# Patient Record
Sex: Female | Born: 1990 | Hispanic: Yes | Marital: Married | Smoking: Never smoker
Health system: Southern US, Community
[De-identification: ages and names within clinical notes are randomized; demographics above are authoritative.]

## PROBLEM LIST (undated history)

## (undated) DIAGNOSIS — E282 Polycystic ovarian syndrome: Secondary | ICD-10-CM

## (undated) DIAGNOSIS — A749 Chlamydial infection, unspecified: Secondary | ICD-10-CM

## (undated) DIAGNOSIS — N200 Calculus of kidney: Secondary | ICD-10-CM

## (undated) HISTORY — DX: Polycystic ovarian syndrome: E28.2

## (undated) HISTORY — DX: Calculus of kidney: N20.0

## (undated) HISTORY — PX: WISDOM TOOTH EXTRACTION: SHX21

## (undated) HISTORY — PX: CHOLECYSTECTOMY, LAPAROSCOPIC: SHX56

## (undated) HISTORY — DX: Chlamydial infection, unspecified: A74.9

---
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
## 2011-06-10 IMAGING — CT CT ABD-PELV W/ CM
1 of 2 series · 16 of 32 positions shown, 20 images · IV contrast (isovue)
Comparison: none

REASON FOR EXAM: (1) pain L>R; (2) same
COMMENTS:

PROCEDURE:     CT  - CT ABDOMEN / PELVIS  W  - May 01, 2010  [DATE]
RESULT:     Comparison:  None
TECHNIQUE: Multiple axial images of the abdomen and pelvis were performed
from the lung bases to the pubic symphysis, with p.o. contrast and with 100
mL of Isovue 370 intravenous contrast.

[Series 2: soft tissue · axial · 0.74mm/px · z∈[-996,-556]mm · 16 of 159 slices shown, 20 images]
[im 6/159  soft-tissue]
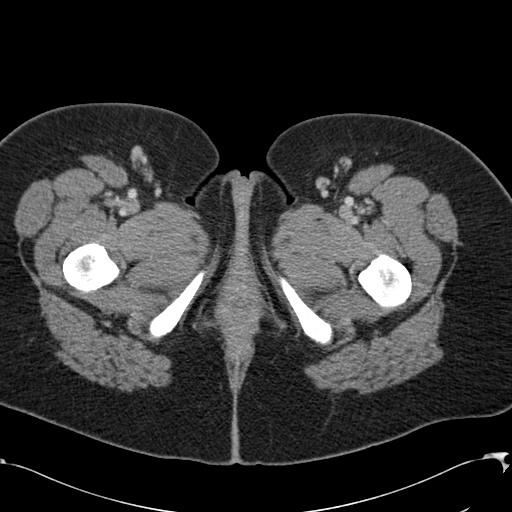
[im 6/159  bone]
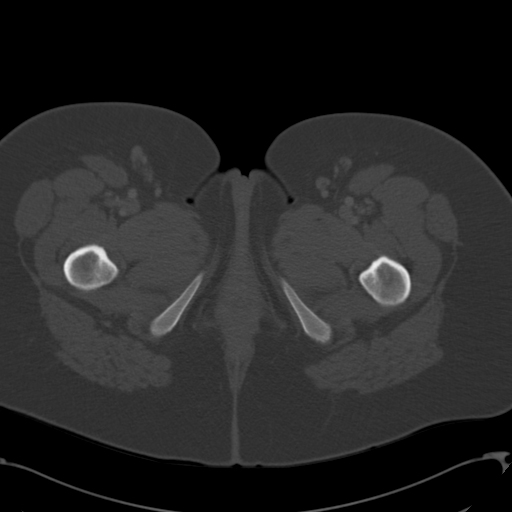
[im 18/159  soft-tissue]
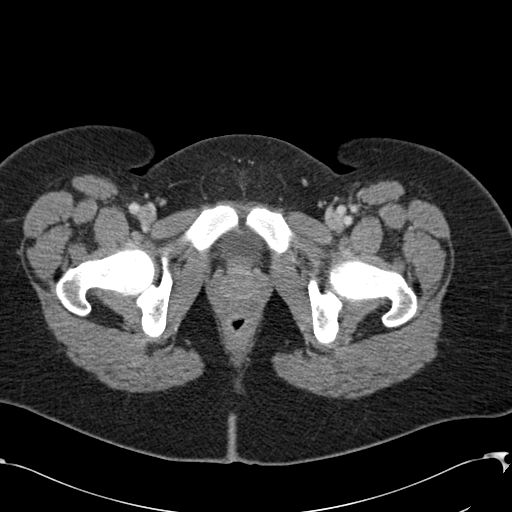
[im 30/159  soft-tissue]
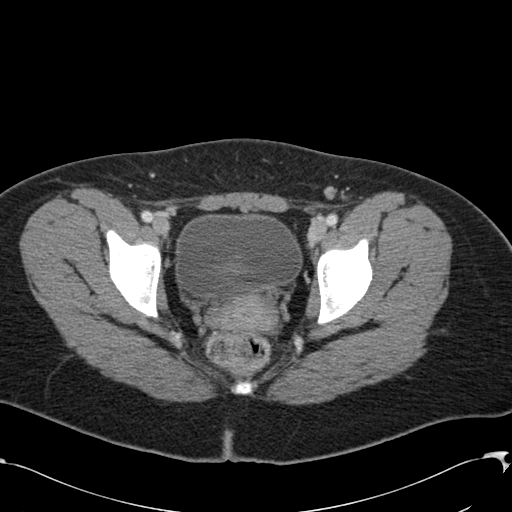
[im 41/159  soft-tissue]
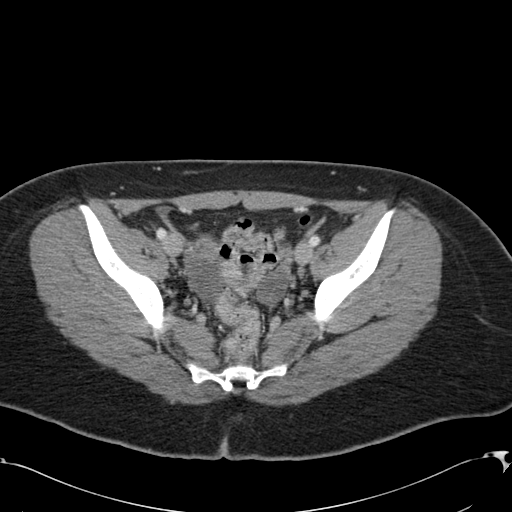
[im 53/159  soft-tissue]
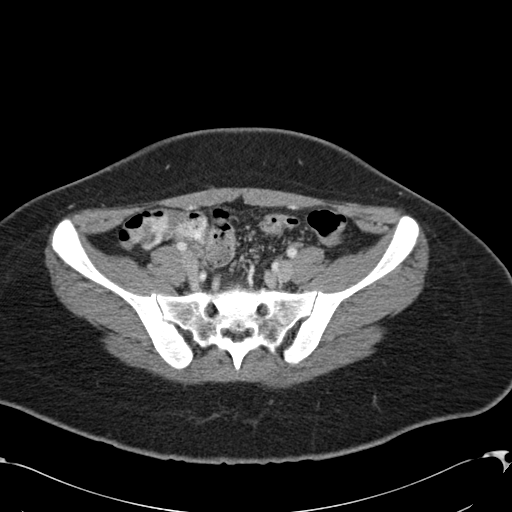
[im 65/159  soft-tissue]
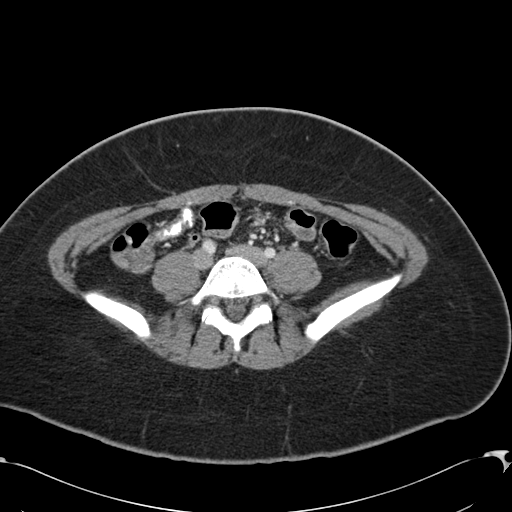
[im 77/159  soft-tissue]
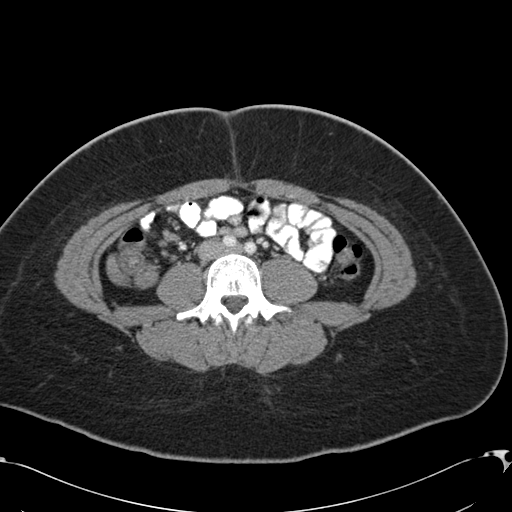
[im 82/159  soft-tissue]
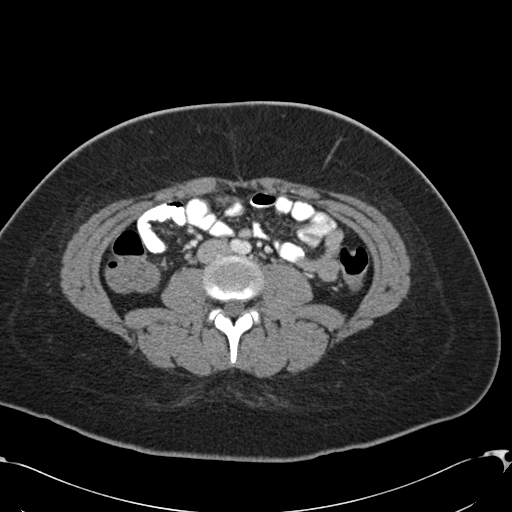
[im 94/159  soft-tissue]
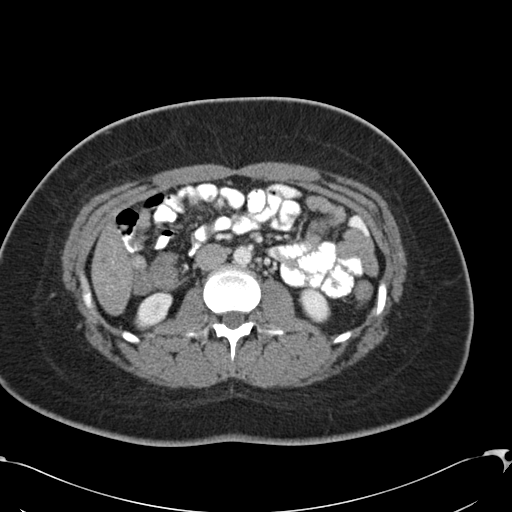
[im 94/159  bone]
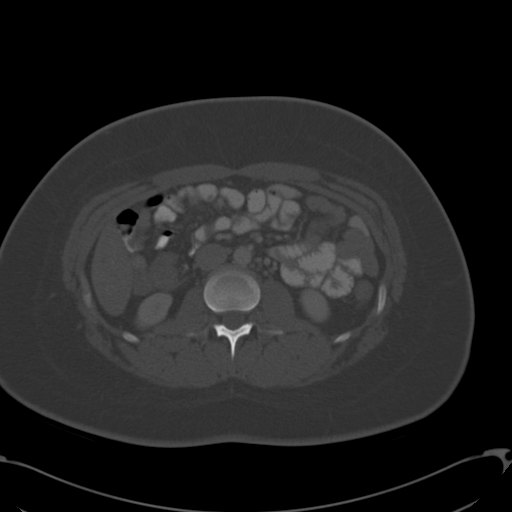
[im 106/159  soft-tissue]
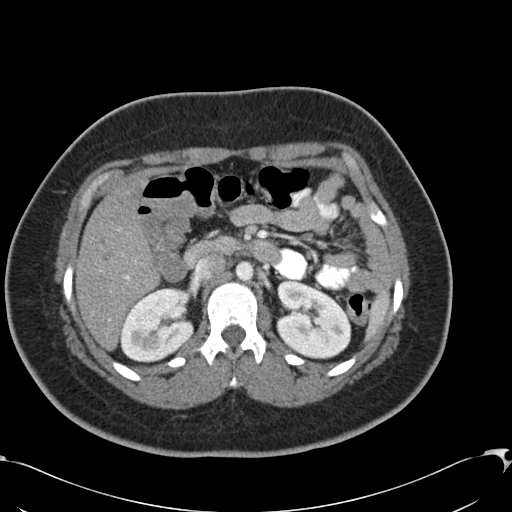
[im 118/159  soft-tissue]
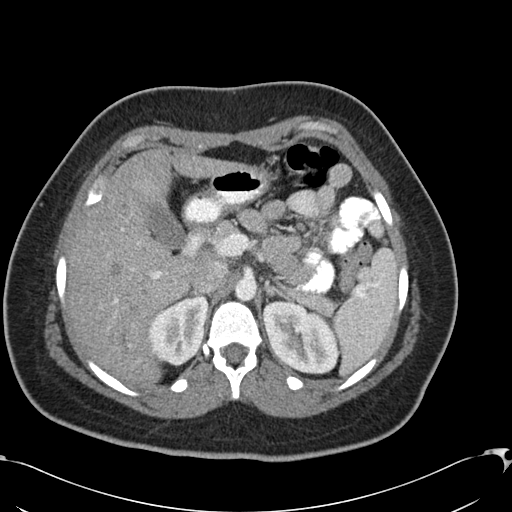
[im 129/159  soft-tissue]
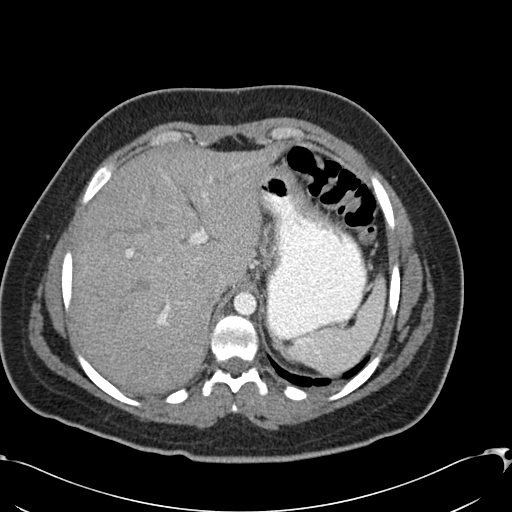
[im 135/159  lung]
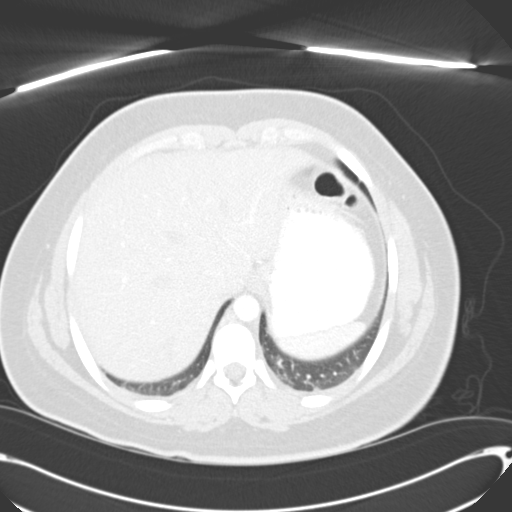
[im 141/159  soft-tissue]
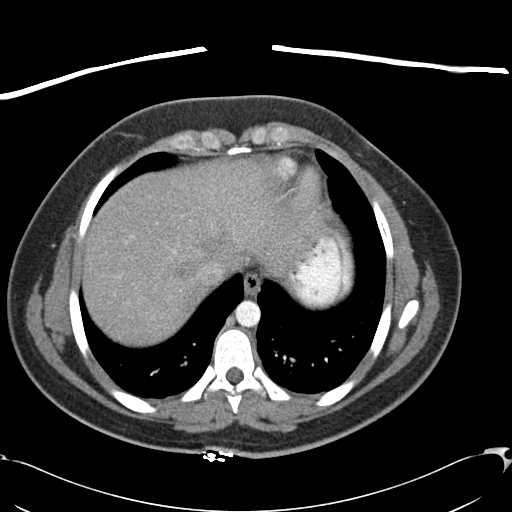
[im 141/159  lung]
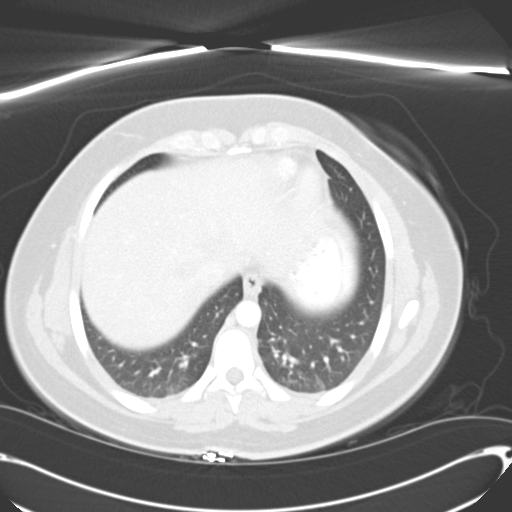
[im 147/159  lung]
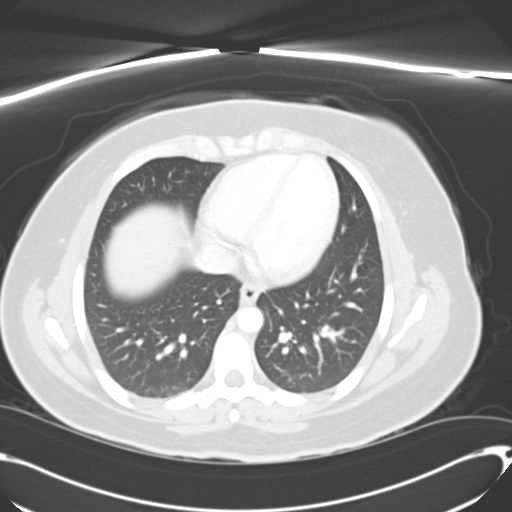
[im 153/159  soft-tissue]
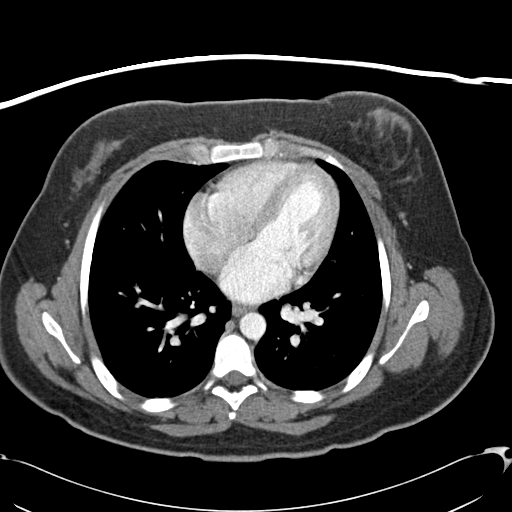
[im 153/159  lung]
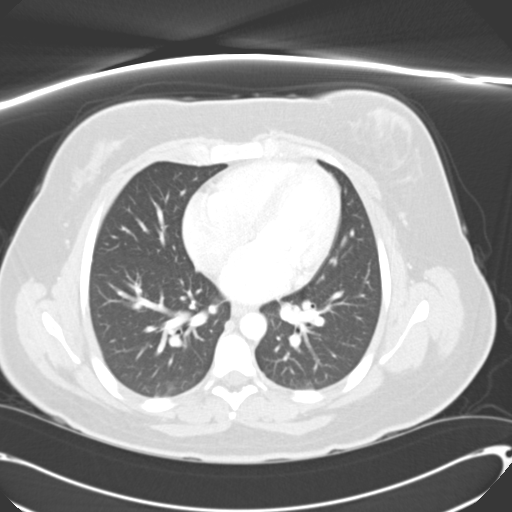

[16 of 32 positions shown; findings below may reference images not displayed]

FINDINGS: The liver, gallbladder, spleen, adrenals, pancreas, and kidneys are
unremarkable. No mesenteric or retroperitoneal lymphadenopathy. The small
and large bowel are normal in caliber. There are bilateral ovarian cysts.
The largest measures 3 cm in greatest dimension. The appendix is normal.

No aggressive lytic or sclerotic osseous lesions identified.
IMPRESSION: No acute findings in the abdomen or pelvis.

## 2012-01-21 ENCOUNTER — Other Ambulatory Visit: Payer: Self-pay

## 2012-01-21 LAB — HCG, QUANTITATIVE, PREGNANCY: Beta Hcg, Quant.: 57881 m[IU]/mL — ABNORMAL HIGH

## 2012-03-07 ENCOUNTER — Emergency Department: Payer: Self-pay | Admitting: Emergency Medicine

## 2012-03-07 LAB — COMPREHENSIVE METABOLIC PANEL
Albumin: 3.2 g/dL — ABNORMAL LOW (ref 3.8–5.6)
Anion Gap: 7 (ref 7–16)
BUN: 9 mg/dL (ref 7–18)
Calcium, Total: 8.7 mg/dL — ABNORMAL LOW (ref 9.0–10.7)
Chloride: 105 mmol/L (ref 98–107)
Co2: 24 mmol/L (ref 21–32)
EGFR (African American): >60
EGFR (Non-African Amer.): >60
Osmolality: 270 (ref 275–301)
Potassium: 3.8 mmol/L (ref 3.5–5.1)
SGPT (ALT): 27 U/L (ref 12–78)
Sodium: 136 mmol/L (ref 136–145)

## 2012-03-07 LAB — CBC WITH DIFFERENTIAL/PLATELET
Basophil #: 0 10*3/uL (ref 0.0–0.1)
Eosinophil %: 0.5 %
Lymphocyte %: 17.5 %
MCV: 86 fL (ref 80–100)
Monocyte %: 8.8 %
Platelet: 178 10*3/uL (ref 150–440)
RBC: 4.04 10*6/uL (ref 3.80–5.20)
RDW: 13 % (ref 11.5–14.5)
WBC: 11.4 10*3/uL — ABNORMAL HIGH (ref 3.6–11.0)

## 2012-03-07 LAB — HCG, QUANTITATIVE, PREGNANCY: Beta Hcg, Quant.: 92276 m[IU]/mL — ABNORMAL HIGH

## 2012-03-07 LAB — URINALYSIS, COMPLETE
Bacteria: NONE SEEN
Bilirubin,UR: NEGATIVE
Blood: NEGATIVE
Glucose,UR: NEGATIVE mg/dL (ref 0–75)
Ketone: NEGATIVE
Leukocyte Esterase: NEGATIVE
Ph: 7 (ref 4.5–8.0)
Protein: NEGATIVE
RBC,UR: 1 /HPF (ref 0–5)
Specific Gravity: 1.011 (ref 1.003–1.030)
Squamous Epithelial: 2

## 2012-03-07 LAB — WET PREP, GENITAL

## 2012-05-18 ENCOUNTER — Observation Stay: Payer: Self-pay | Admitting: Obstetrics & Gynecology

## 2012-05-18 LAB — URINALYSIS, COMPLETE
Bilirubin,UR: NEGATIVE
Blood: NEGATIVE
Glucose,UR: NEGATIVE mg/dL (ref 0–75)
Nitrite: NEGATIVE
Protein: NEGATIVE
RBC,UR: 1 /HPF (ref 0–5)
WBC UR: 4 /HPF (ref 0–5)

## 2012-09-11 ENCOUNTER — Observation Stay: Payer: Self-pay | Admitting: Obstetrics and Gynecology

## 2012-09-13 ENCOUNTER — Observation Stay: Payer: Self-pay | Admitting: Obstetrics & Gynecology

## 2012-09-16 ENCOUNTER — Inpatient Hospital Stay: Payer: Self-pay | Admitting: Obstetrics and Gynecology

## 2012-09-16 LAB — CBC WITH DIFFERENTIAL/PLATELET
Basophil #: 0 10*3/uL (ref 0.0–0.1)
Eosinophil %: 0.1 %
HCT: 40.9 % (ref 35.0–47.0)
Lymphocyte #: 1.9 10*3/uL (ref 1.0–3.6)
Lymphocyte %: 11.4 %
MCV: 88 fL (ref 80–100)
Monocyte #: 1.1 x10 3/mm — ABNORMAL HIGH (ref 0.2–0.9)
Monocyte %: 6.4 %
Neutrophil #: 13.4 10*3/uL — ABNORMAL HIGH (ref 1.4–6.5)
Neutrophil %: 81.9 %
RDW: 14 % (ref 11.5–14.5)
WBC: 16.4 10*3/uL — ABNORMAL HIGH (ref 3.6–11.0)

## 2012-09-16 LAB — PLATELET COUNT: Platelet: 138 10*3/uL — ABNORMAL LOW (ref 150–440)

## 2012-09-17 LAB — CBC WITH DIFFERENTIAL/PLATELET
Basophil %: 0.1 %
Eosinophil #: 0 10*3/uL (ref 0.0–0.7)
HCT: 36.1 % (ref 35.0–47.0)
HGB: 11.6 g/dL — ABNORMAL LOW (ref 12.0–16.0)
Lymphocyte #: 1.2 10*3/uL (ref 1.0–3.6)
MCH: 28.2 pg (ref 26.0–34.0)
MCHC: 32.2 g/dL (ref 32.0–36.0)
MCV: 88 fL (ref 80–100)
Neutrophil #: 14.9 10*3/uL — ABNORMAL HIGH (ref 1.4–6.5)
Neutrophil %: 89 %
RBC: 4.13 10*6/uL (ref 3.80–5.20)
RDW: 14.1 % (ref 11.5–14.5)
WBC: 16.7 10*3/uL — ABNORMAL HIGH (ref 3.6–11.0)

## 2012-09-21 ENCOUNTER — Emergency Department: Payer: Self-pay | Admitting: Emergency Medicine

## 2013-04-16 IMAGING — US US OB < 14 WEEKS
1 series · 14 of 28 positions shown · non-contrast
Comparison: none

REASON FOR EXAM: 12? weeks pregnant, left lower quadrant pain
COMMENTS:

PROCEDURE:     US  - US OB LESS THAN 14 WEEKS  - March 07, 2012  [DATE]
RESULT:     Comparison: None
TECHNIQUE: Multiple transabdominal gray-scale images and endovaginal
gray-scale images of the pelvis performed.

[Series 1: us ob < 14 weeks · 0.25mm/px · 14 of 62 slices shown]
[im 3/62]
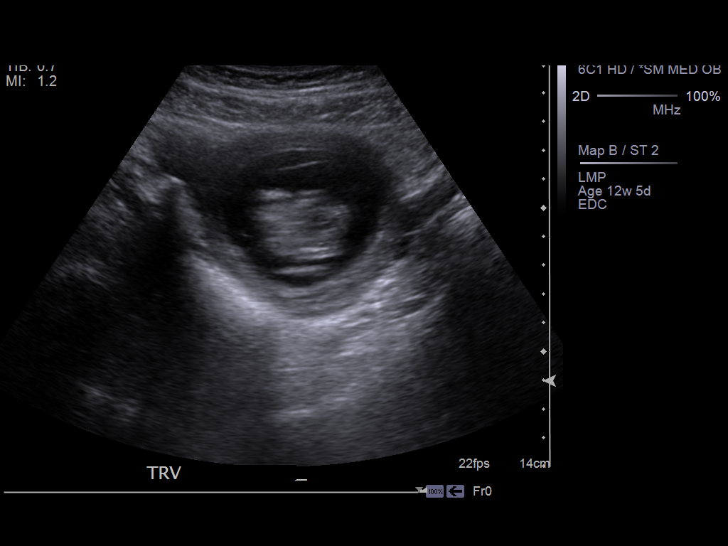
[im 7/62]
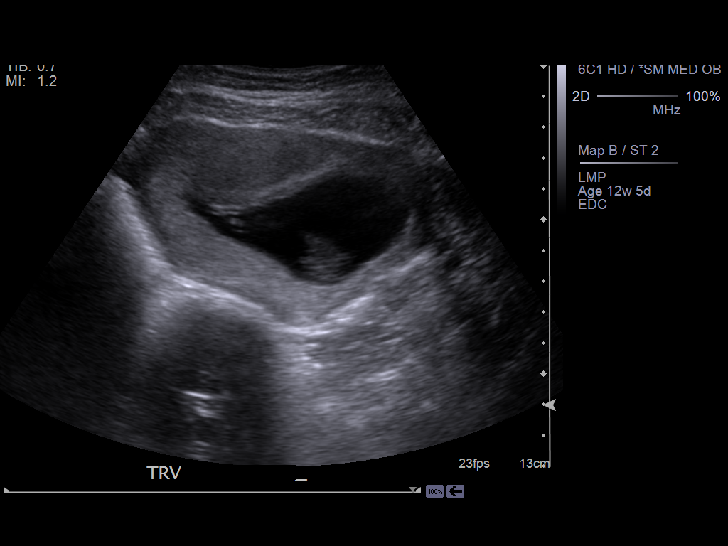
[im 12/62]
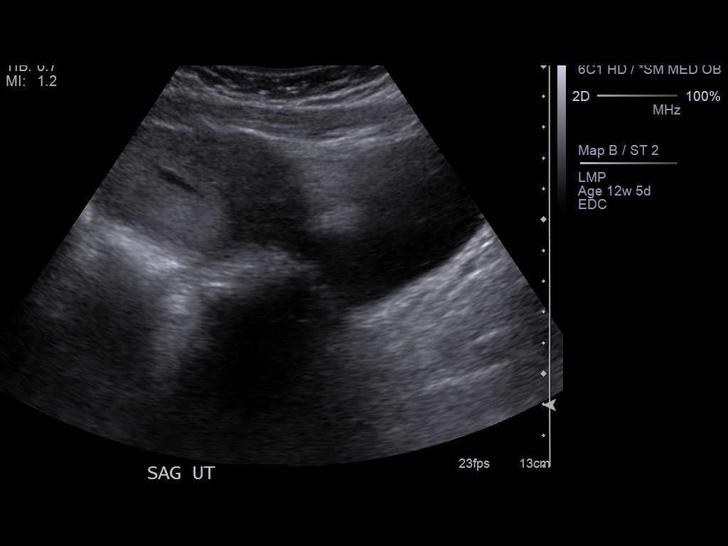
[im 16/62]
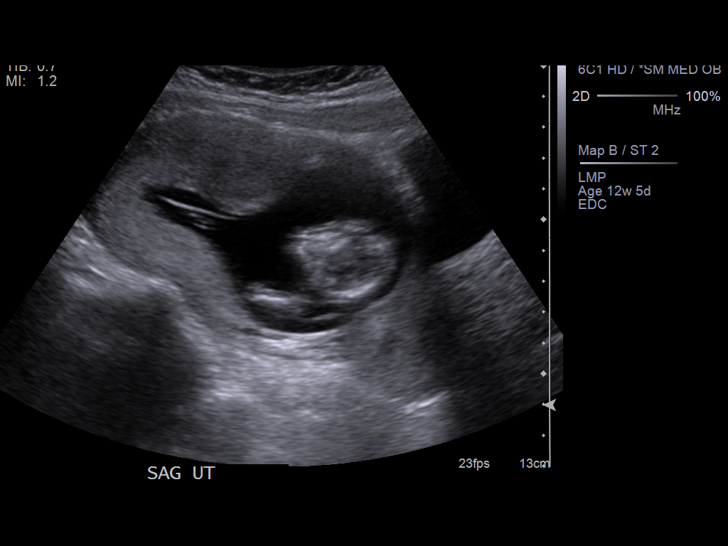
[im 21/62]
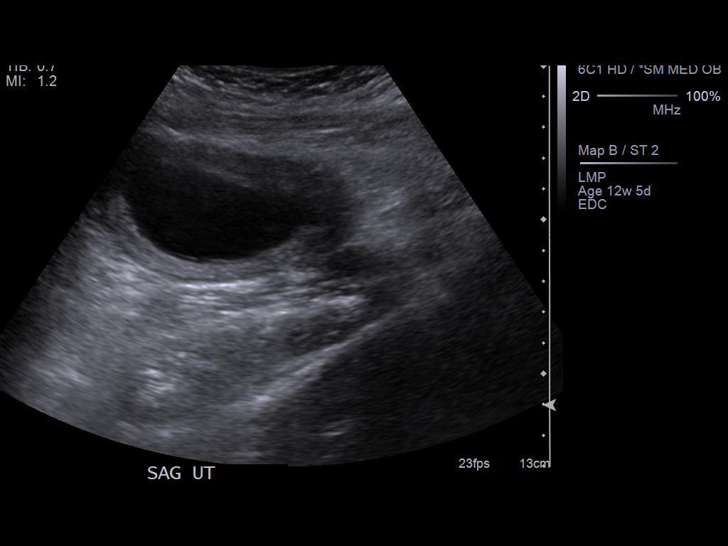
[im 25/62]
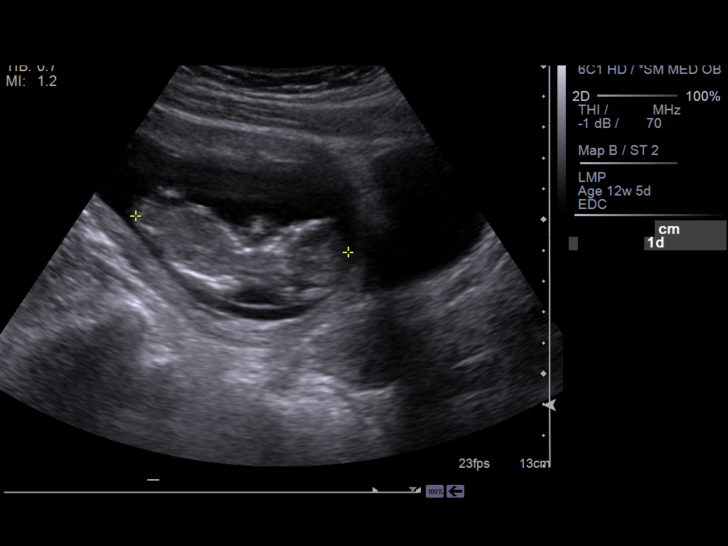
[im 30/62]
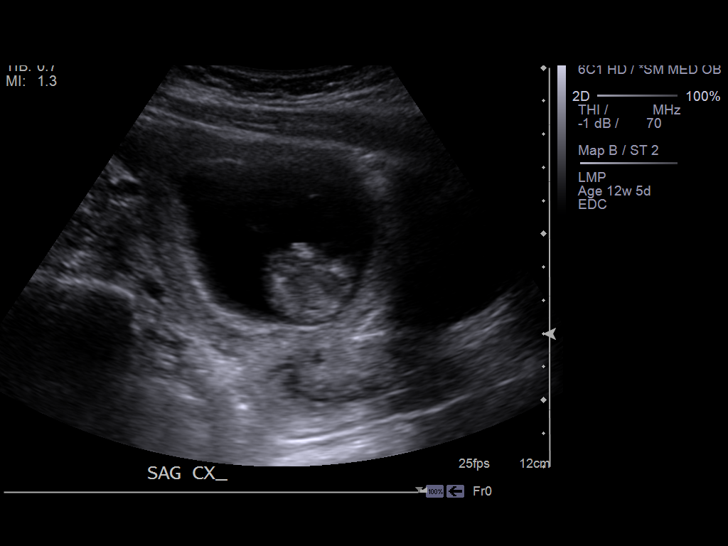
[im 34/62]
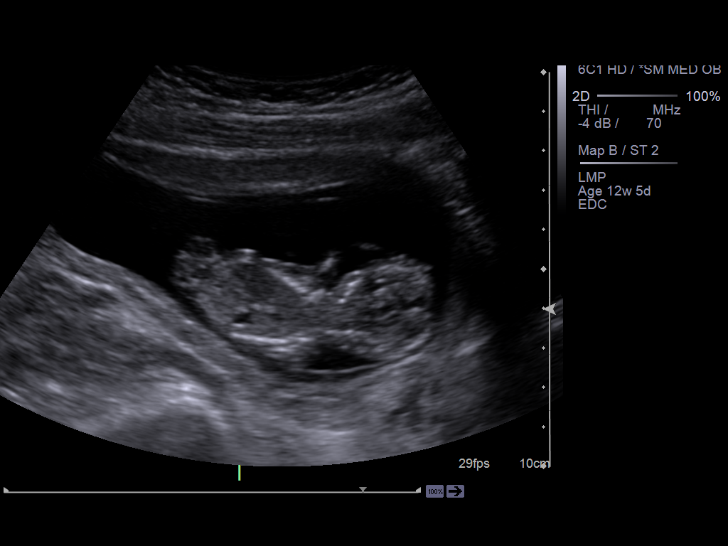
[im 39/62]
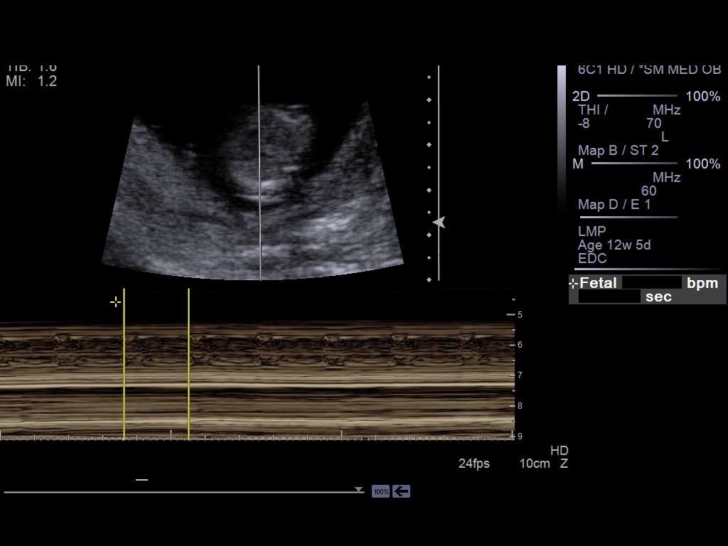
[im 43/62]
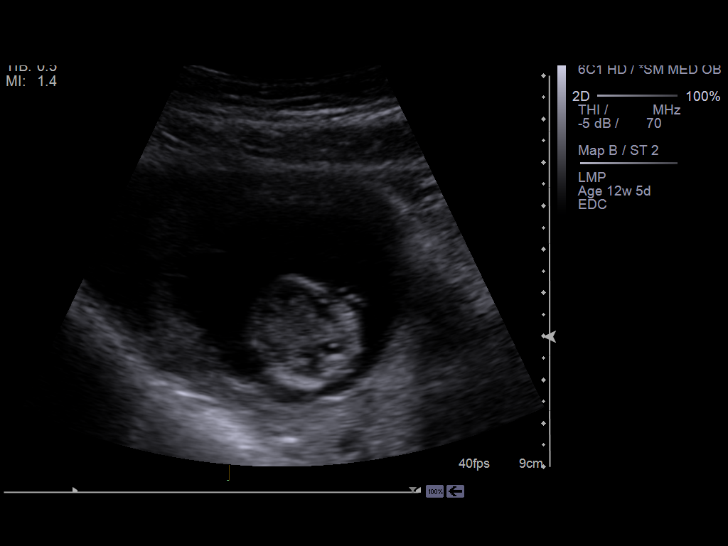
[im 48/62]
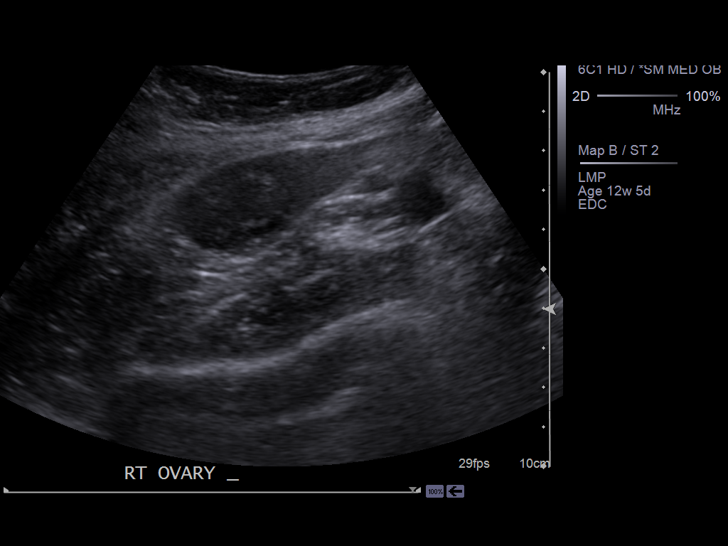
[im 52/62]
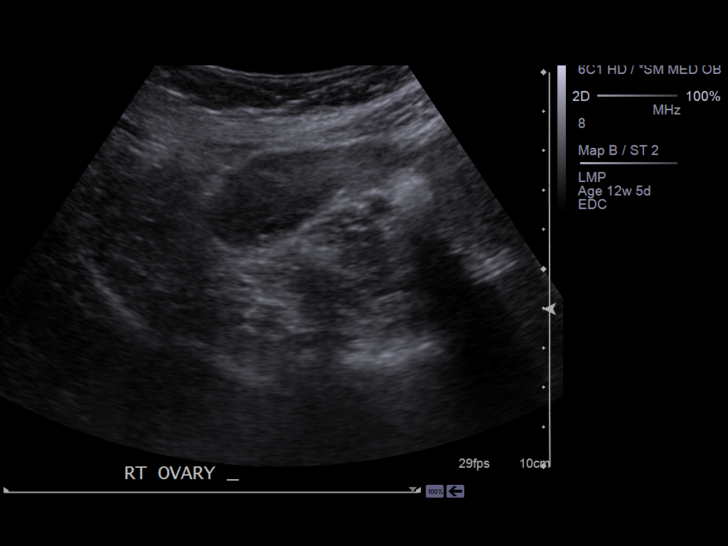
[im 57/62]
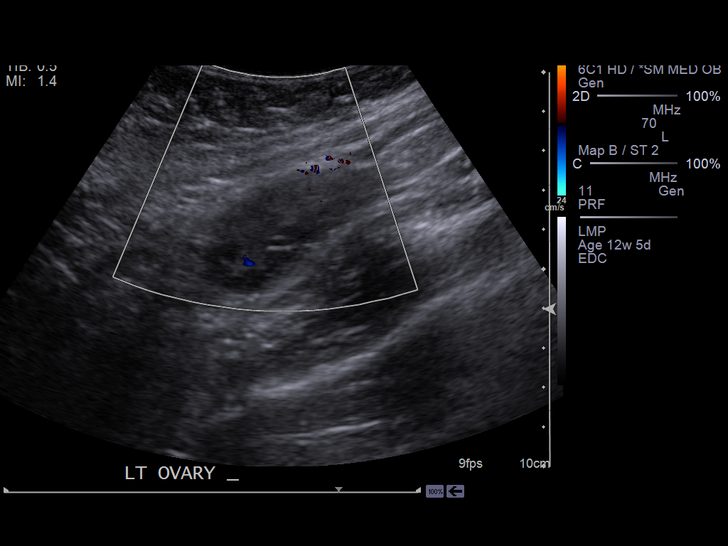
[im 62/62]
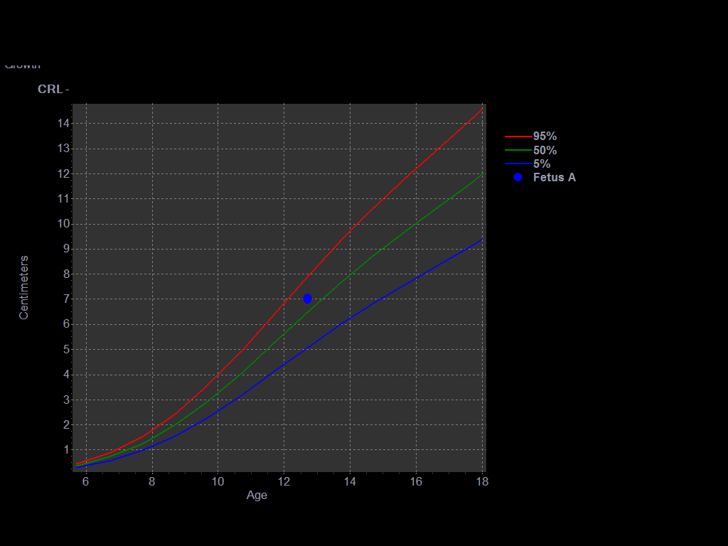

[14 of 28 positions shown; findings below may reference images not displayed]

FINDINGS: There is a single live intrauterine pregnancy visualized with a crown-rump
length of 7.01 cm dating the pregnancy at 13 weeks 2 days. There is a normal
yolk sac. There is a normal fetal heart rate of 158 beats per minute. There
is a small amount of fluid in the cervical canal.

The right ovary measures 4 x 2.7 x 2.2 cm.  The left ovary measures 4.3 x
2.4 x 2.1 cm.  There is no adnexal mass.

There is no pelvic free fluid.
IMPRESSION: Single live intrauterine pregnancy dating 13 weeks 2 days with an estimated
due date of 09/10/2012.

[REDACTED]

## 2013-08-25 ENCOUNTER — Emergency Department: Payer: Self-pay | Admitting: Emergency Medicine

## 2014-04-10 ENCOUNTER — Emergency Department: Payer: Self-pay | Admitting: Emergency Medicine

## 2014-10-04 IMAGING — CR DG CHEST 2V
1 series · 2 of 2 positions shown · non-contrast
Comparison: None.

CLINICAL DATA: Cough, vomiting and lower chest pain.

EXAM:
CHEST  2 VIEW

[Series 1: w chest pa · 0.14mm/px · 2 of 2 slices shown]
[im 1/2]
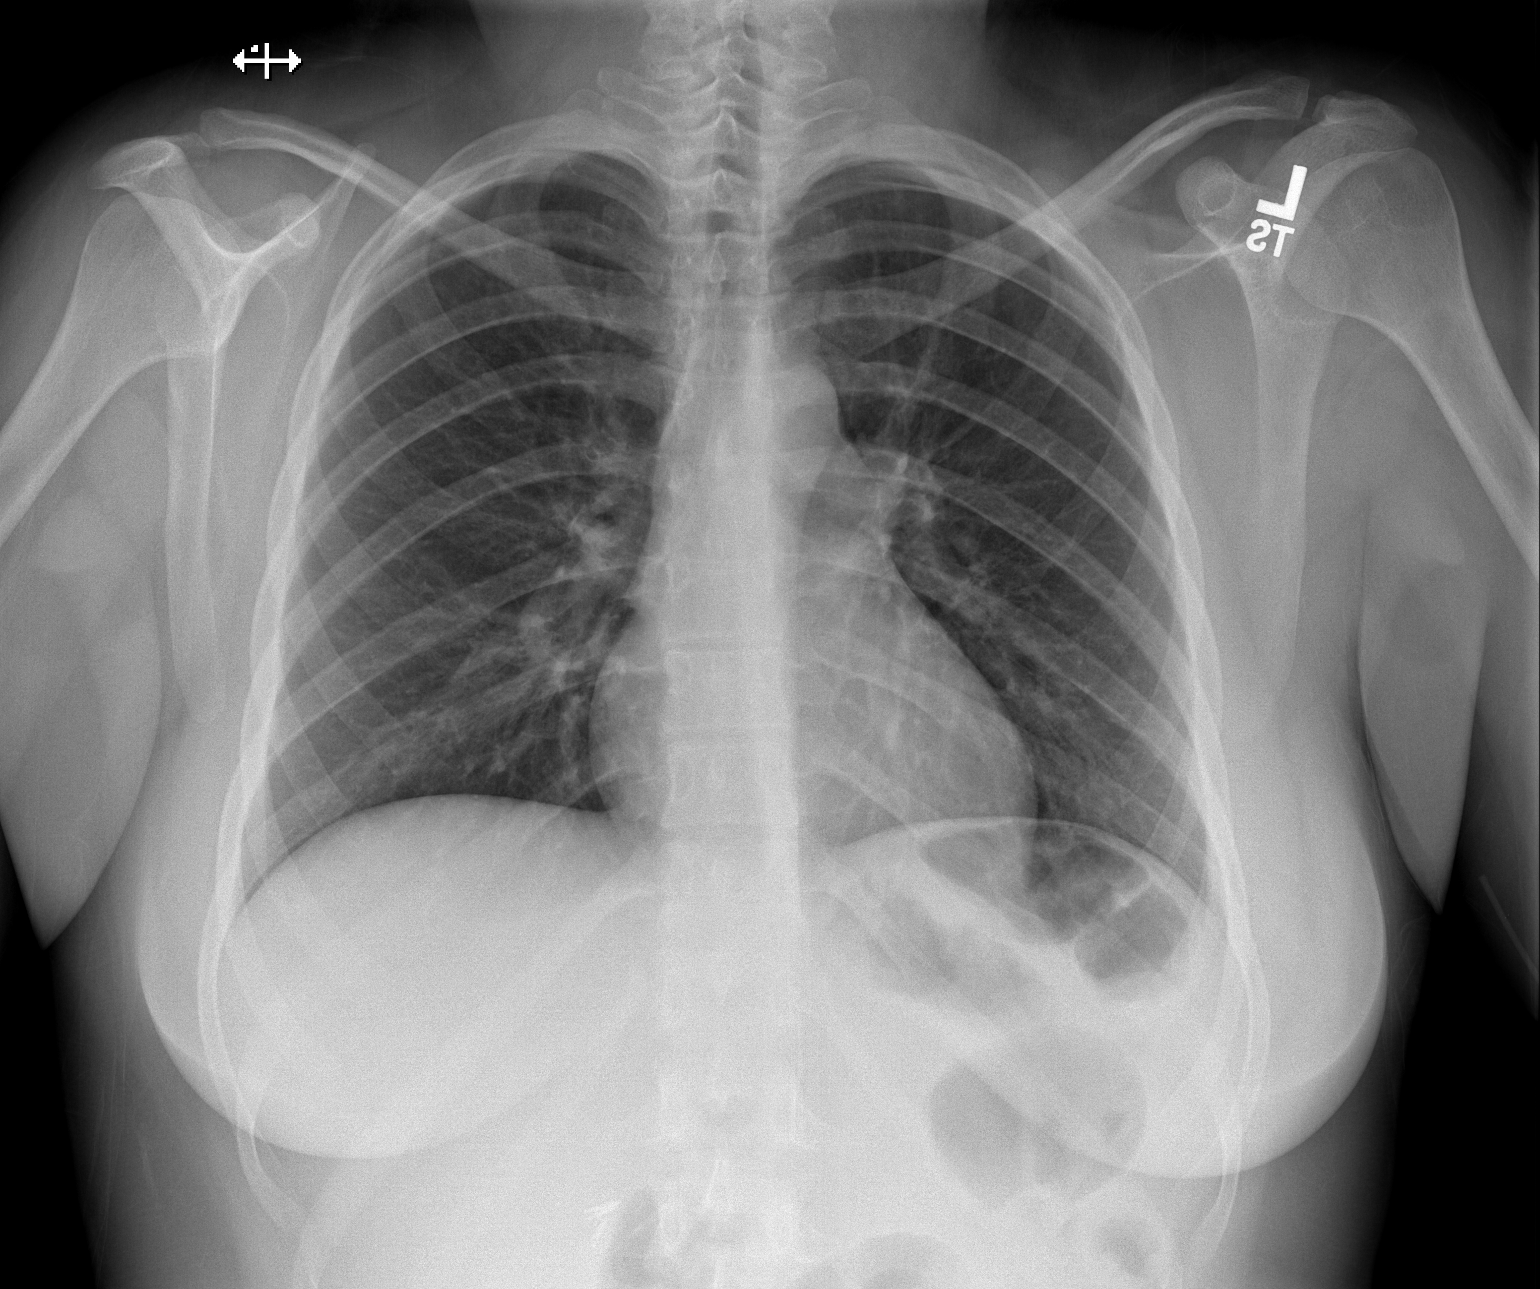
[im 2/2]
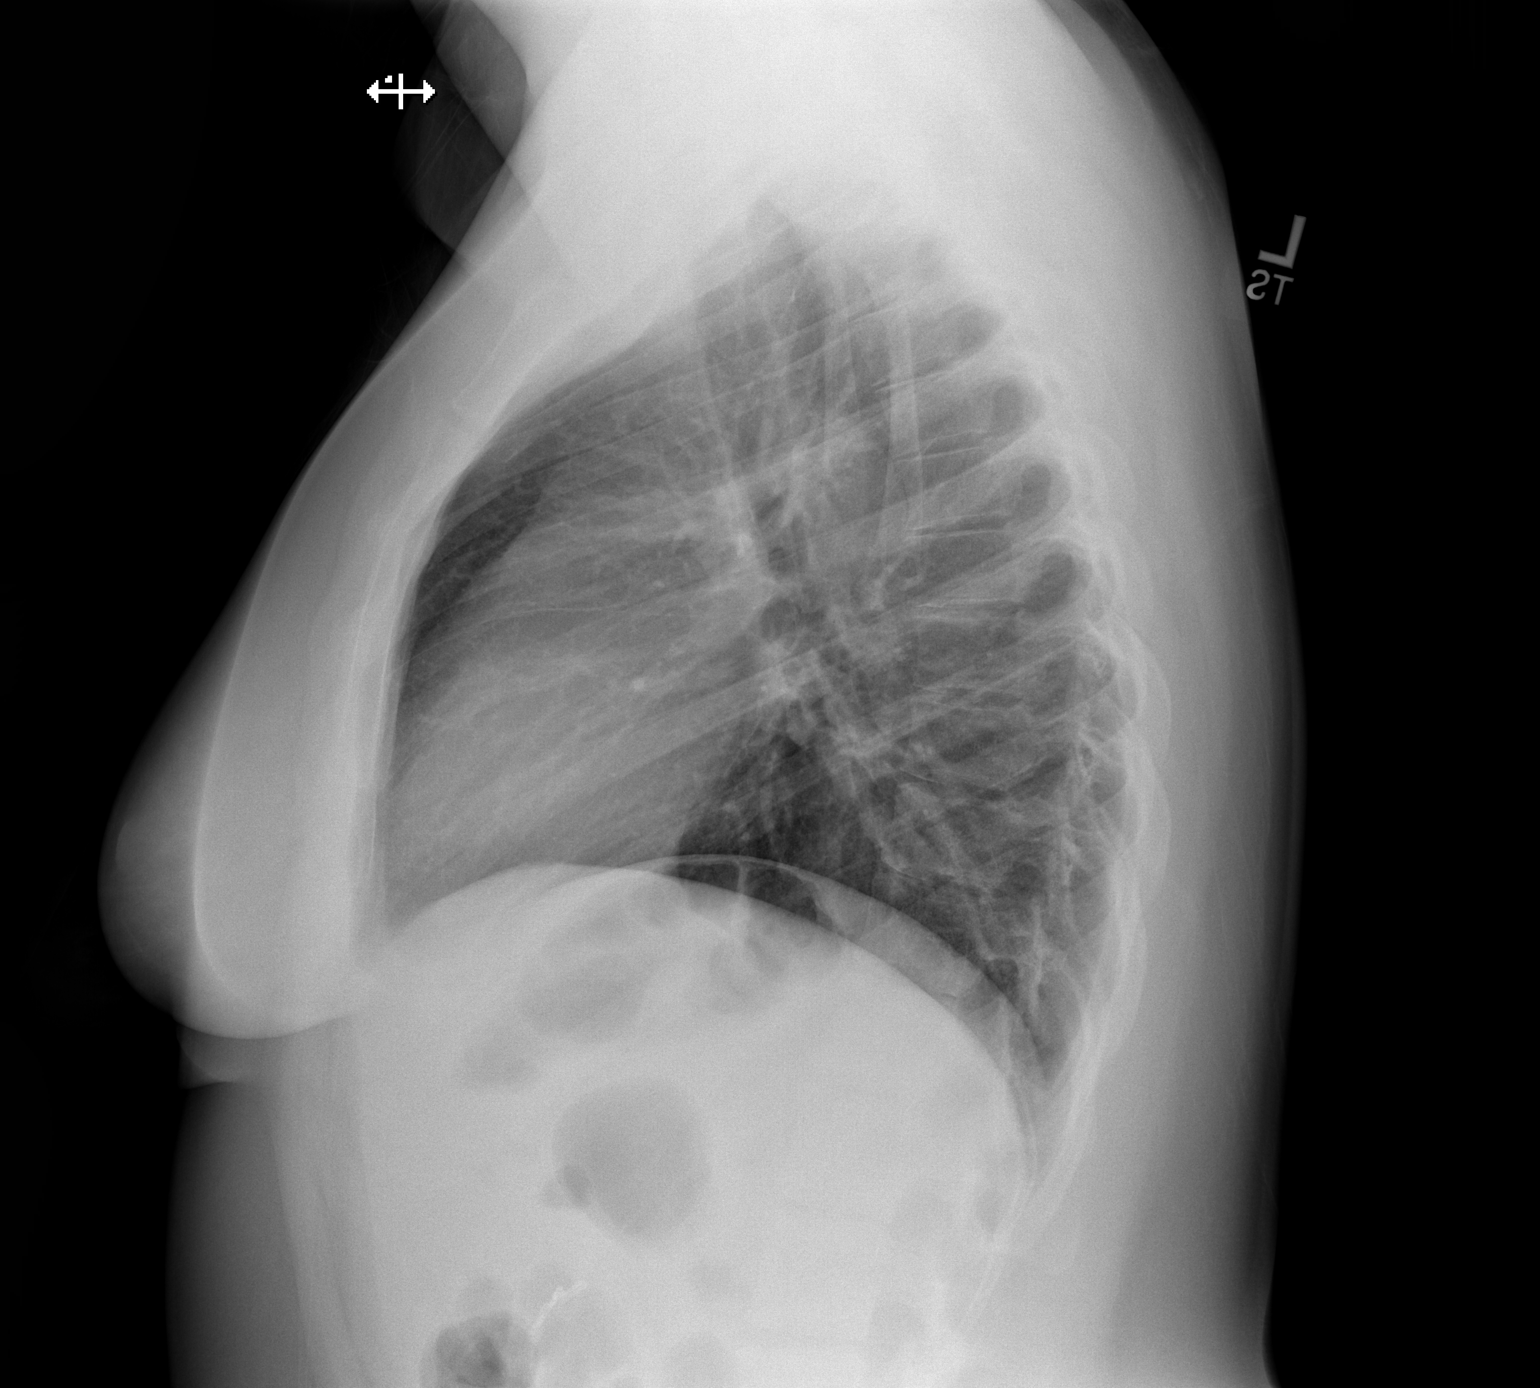

[2 of 2 positions shown; findings below may reference images not displayed]

FINDINGS: The lungs are well-aerated and clear. There is no evidence of focal
opacification, pleural effusion or pneumothorax.

The heart is normal in size; the mediastinal contour is within
normal limits. No acute osseous abnormalities are seen.
IMPRESSION: No acute cardiopulmonary process seen.

## 2014-10-22 NOTE — Op Note (Signed)
PATIENT NAME:  Nancy Ramos, Nancy Ramos MR#:  [id number] DATE OF BIRTH:  1991-04-12  DATE OF PROCEDURE:  09/16/2012  PREOPERATIVE DIAGNOSES: 1.  G1 at 40 weeks, 2 days gestation.  2.  Nonreassuring fetal surveillance, recurrent and late decelerations and fetal tachycardia.  3.  Right occiput posterior position.  4.  Terminal meconium.  5.  Failed attempted application of forceps.   POSTOPERATIVE DIAGNOSES:  1.  G1 at 40 weeks, 2 days gestation.  2.  Nonreassuring fetal surveillance, recurrent and late decelerations and fetal tachycardia.  3.  Right occiput posterior position.  4.  Terminal meconium.  5.  Failed attempted application of forceps.  OPERATION PERFORMED: Low transverse C-section via Pfannenstiel skin incision.  ANESTHESIA:  General.   PRIMARY SURGEON: Florina OuAndreas M. Bonney AidStaebler, MD.  PREOPERATIVE ANTIBIOTICS: 2 grams Ancef.   ESTIMATED BLOOD LOSS: 800 mL   OPERATIVE FLUIDS: 1600 mL   DRAINS OR TUBES:  Foley to gravity drainage and On-Q catheter system.   IMPLANTS: None.   INTRAOPERATIVE FINDINGS: The patient had an attempt at forceps placement secondary to recurrent late decelerations in the second stage of labor. The fetus was noted to be in the right occiput posterior position and the right forceps blade was unable to be brought into correct alignment to allow articulation of forceps and forceps delivery was therefore aborted. The fetus was noted to be deeply impacted within the pelvis at the time of delivery. In order to facilitate delivery, the rectus muscle on the left had to be partially cut.   INTRAOPERATIVE FINDINGS: Included normal tubes, ovaries and uterus. Delivery resulted in the birth of a liveborn female infant weighing 6 pounds 12 ounces, 3050 grams, Apgars 1 and 7.   SPECIMENS:  None.  PATIENT CONDITION FOLLOWING PROCEDURE: Stable.   PROCEDURE IN DETAIL: Risks, benefits, and alternatives of the procedure were discussed with the patient prior to proceeding to  the operating room. The patient was taken to the operating room. Spinal anesthesia was administered. She was positioned in the supine position, prepped and draped in the usual sterile fashion. A timeout was performed. The level of anesthetic was checked and noted to be adequate. Pfannenstiel skin incision was made 2 cm above the pubic symphysis and carried down sharply to the level rectus fascia. The rectus fascia was incised with the knife. The superior border of the rectus fascia was grasped with 2 Kocher clamps. The underlying rectus muscle was dissected off the fascia using blunt dissection and the median raphe incised using Mayo scissors. This was then repeated for the inferior border of the rectus fascia in a similar manner. The midline was identified. The peritoneum was entered bluntly. A bladder blade was placed. A bladder flap was then developed using Metzenbaum scissors, after which the bladder blade was replaced and the bladder was displaced caudad. A low transverse incision was made on the uterus and the uterus was entered bluntly using the operator'Ramos finger. On placing the operator'Ramos hand into the hysterotomy, the infant was noted to be in the right occiput posterior position. There was some difficulty coping and breaking the suction secondary to low station of the fetus. In order to facilitate delivery of the fetal head, the left rectus muscle had to be partially incised using bandage scissors. The fetus was delivered using fundal pressure. After flexing the vertex and bring it into the hysterotomy, the remainder of the body delivered without difficulty. The infant was suctioned, cord was clamped and cut before handing the infant to the  awaiting pediatrician. Cord gas was obtained both venous and arterial. The arterial sample revealed a pH of 7.08 with base excess of -8.4. The venous sample revealed a pH of 7.16 with base excess of -6.6. Cord blood was also obtained. The placenta was delivered using  manual extraction. The uterus was exteriorized and repaired in a 2-layer manner, with the first layer being a running locked stitch of 0 Vicryl with the second horizontal imbricating layer of 0 Vicryl. The uterus was returned to the abdomen. The peritoneal gutters were wiped clean of clots and debris using 2 moist laps. The peritoneum was then closed using a 2-0 Vicryl in a running fashion. Single mattress stitch was used to reapproximate the left rectus muscle with good hemostasis noted. The On-Q catheter system was then placed superiorly to the Pfannenstiel skin incision as per usual protocol. The fascia was closed using a looped #1 PDS in a running fashion. The subcutaneous tissue was irrigated and hemostasis achieved using the Bovie. Skin was closed using insorb staples. Sponge, needle and instrument counts were correct x 2. The patient tolerated the procedure well and was taken to the recovery room in stable condition.     ____________________________ Florina Ou. Bonney Aid, MD ams:cc D: 09/18/2012 17:44:37 ET T: 09/18/2012 19:02:09 ET JOB#: [id number]  cc: Florina Ou. Bonney Aid, MD, <Dictator> [place] Cathrine Muster MD ELECTRONICALLY SIGNED 09/19/2012 9:27

## 2014-11-09 NOTE — H&P (Signed)
L&D Evaluation:  History:   HPI 24yo G1 at 824w1d by D=6wk U/S presents with c/o very light bleeding mixed with mucous, only with urination over the last 12 hours.  No bleeding on panties between voids.  Also c/o mild cramping.  No suprapubic pain or dysuria.  +FM.  No LOF.  [facility] by first trimester threatened ab.  Blood type Rh+.  Posterior placenta.    Presents with vaginal bleeding    Patient's Medical History Obesity    Patient's Surgical History Colecystectomy  WTE    Medications Pre Natal Vitamins    Allergies NKDA    Social History none    Family History Non-Contributory   ROS:   ROS All systems were reviewed.  HEENT, CNS, GI, GU, Respiratory, CV, Renal and Musculoskeletal systems were found to be normal., except as noted in HPI   Exam:   Vital Signs stable    General no apparent distress    Mental Status clear    Chest normal effort    Abdomen gravid, non-tender, no suprapubic tenderness    Back no CVAT    Pelvic no external lesions, cervix closed and thick, no blood in vault    Mebranes Intact    FHT normal rate with no decels    Ucx absent    Skin dry    Other UA negative   Impression:   Impression 3224w1d with light vaginal bleeding, benign exam and evaluation   Plan:   Plan reassurance provided, reviewed PTL and bleeding precautions    Follow Up Appointment already scheduled. in 1 week   Electronic Signatures: Garnette GunnerStansbury Clipp, Ali LoweEryn K (MD)  (Signed [phone]-Nov-13 21:34)  Authored: L&D Evaluation   Last Updated: 17-Nov-13 21:34 by Garnette GunnerStansbury Clipp, Ali LoweEryn K (MD)

## 2017-06-17 ENCOUNTER — Ambulatory Visit: Payer: Self-pay | Admitting: Obstetrics and Gynecology

## 2017-06-20 ENCOUNTER — Encounter: Payer: Self-pay | Admitting: Obstetrics and Gynecology

## 2017-06-20 ENCOUNTER — Ambulatory Visit: Payer: Self-pay | Admitting: Obstetrics and Gynecology

## 2018-01-21 ENCOUNTER — Ambulatory Visit: Payer: Self-pay | Admitting: Physician Assistant

## 2018-01-21 VITALS — BP 130/86 | HR 79 | Resp 16 | Ht 61.0 in | Wt 225.0 lb

## 2018-01-21 DIAGNOSIS — G44209 Tension-type headache, unspecified, not intractable: Secondary | ICD-10-CM

## 2018-01-21 NOTE — Progress Notes (Signed)
S: 27 year old female [facility] with long-standing history of headaches. Patient states she has headaches for years. Became more severe when she was pregnant, 5-6 years ago. Treated by Ob/Gyn with OTC vitamins and Tylenol. Patient states she has headaches 2-3 times per week. Typically mild/dull. 2-3/10 in severity. Located across forehead, just above eyebrows. Over past several months has become more severe. Patient attributes this to stress; recent move and now has new job (x 6 months) and going to school. Patient reports new sense of apathy (sometimes doesn't want to get out of bed) and fatigue. Patient also reports increased emotionality; states she feels stressed/overwhelmed. Denies homicidal of suicidal ideation. No previous diagnosis of anxiety. Patient states current presenting symptom is worsening headache past 2 days. 8/10 in severity (feels same as typical headache, just more severe - located across forehead, equal bilaterally). Resolves with rest. Then comes back with movement or looking at a computer screen. Associated sensitivity to sound and bright light. Describes as throbbing/tightness. Improves with OTC Advil and applying Menthol to forehead. Denies dizziness/lightheadedness, tinnitus, change in vision, nausea/vomiting, neck pain, fever/chills, associated URI symptoms, extremity weakness/paresthesias. Patient has IUD x 1 year. Amenorrhea on IUD. Has not taken an at home pregnancy test.  O: Patient sitting comfortably on examination table. In no acute distress. VSS. Pupils equal round and reactive to light. Mild photosensitivity. TMs WNL. Facial symmetry; raising eyebrows, closing eyes, puffing cheeks, smiling. Tongue and uvula midline. Throat with no erythema. No cervical lymphadenopathy. No pain with neck flexion. Heart regular rate and rhythm. Lungs clear to auscultation. Finger to nose WNL. No pronator drift. RAMs WNL. No gait abnormality. 5/5  upper and lower extremity motor strength. Sensation intact.  A: Tension headache  P: Prescribed patient Medrol Dosepak and Diazepam (quantity #10) for acute flare-up. Discussed long term treatment. Advised patient follow-up with PCP[facility], for [facility]. Suggested amitryptiline vs starting an SSRI; as patient has associated anxiety disorder. Discussed red flag symptoms warranting immediate re-evaluation including but not limited to worsening headache, intractable vomiting, extremity weakness/paresthesias, gait abnormality, confusion, slurred speech, or other new/concerning symptom. Gave patient work note for today (left work early) and for tomorrow (just in case).

## 2019-06-22 ENCOUNTER — Other Ambulatory Visit: Payer: Self-pay

## 2020-05-18 DIAGNOSIS — F4312 Post-traumatic stress disorder, chronic: Secondary | ICD-10-CM | POA: Diagnosis not present

## 2020-06-07 ENCOUNTER — Encounter: Payer: Self-pay | Admitting: Adult Health

## 2020-06-07 ENCOUNTER — Other Ambulatory Visit: Payer: Self-pay

## 2020-06-07 ENCOUNTER — Ambulatory Visit (INDEPENDENT_AMBULATORY_CARE_PROVIDER_SITE_OTHER): Payer: BC Managed Care – PPO | Admitting: Adult Health

## 2020-06-07 VITALS — BP 119/80 | HR 93 | Temp 98.3°F | Resp 16 | Ht 61.0 in | Wt 238.2 lb

## 2020-06-07 DIAGNOSIS — R5383 Other fatigue: Secondary | ICD-10-CM

## 2020-06-07 DIAGNOSIS — R635 Abnormal weight gain: Secondary | ICD-10-CM | POA: Diagnosis not present

## 2020-06-07 DIAGNOSIS — E559 Vitamin D deficiency, unspecified: Secondary | ICD-10-CM | POA: Diagnosis not present

## 2020-06-07 DIAGNOSIS — Z1389 Encounter for screening for other disorder: Secondary | ICD-10-CM | POA: Diagnosis not present

## 2020-06-07 DIAGNOSIS — Z975 Presence of (intrauterine) contraceptive device: Secondary | ICD-10-CM | POA: Diagnosis not present

## 2020-06-07 DIAGNOSIS — Z113 Encounter for screening for infections with a predominantly sexual mode of transmission: Secondary | ICD-10-CM

## 2020-06-07 DIAGNOSIS — F419 Anxiety disorder, unspecified: Secondary | ICD-10-CM

## 2020-06-07 DIAGNOSIS — Z6841 Body Mass Index (BMI) 40.0 and over, adult: Secondary | ICD-10-CM

## 2020-06-07 LAB — POCT URINALYSIS DIPSTICK
Bilirubin, UA: NEGATIVE
Glucose, UA: NEGATIVE
Ketones, UA: NEGATIVE
Leukocytes, UA: NEGATIVE
Nitrite, UA: NEGATIVE
Protein, UA: NEGATIVE
Spec Grav, UA: 1.01 (ref 1.010–1.025)
Urobilinogen, UA: 0.2 E.U./dL
pH, UA: 6.5 (ref 5.0–8.0)

## 2020-06-07 LAB — POCT URINE PREGNANCY: Preg Test, Ur: NEGATIVE

## 2020-06-07 MED ORDER — BUPROPION HCL ER (XL) 300 MG PO TB24: 300.0000 mg | ORAL_TABLET | Freq: Every day | ORAL | 0 refills | Status: DC

## 2020-06-07 NOTE — Progress Notes (Signed)
Please order MICROSCOPIC URINE. Non hemolyzed trace blood in urine point of care test.

## 2020-06-07 NOTE — Progress Notes (Signed)
New patient visit   Patient: Nancy Ramos   DOB: May 22, 1991   29 y.o. Female  MRN: [id number] Visit Date: 06/07/2020  Today's healthcare provider: Jairo Ben, FNP   Chief Complaint  Patient presents with  . New Patient (Initial Visit)   Subjective    Nancy Ramos is a 29 y.o. female who presents today as a new patient to establish care.  HPI  Patient comes to our office from [facility], she states that she feels fairly well today but would like to discuss having her Mirena removed, patient reports that it was placed in 4.5years ago and would also like to discuss how long it would take to conceive after having mirena removed. She is thinking about conceiving. She has an 28 year old.  She was previously seen at [facility].  She is unsure of her last pap smear.    Patient reports that she has concerns over weight gain in the past year and would like to discuss options today for appetite suppressant. Patient reports that she works two jobs, she follows a well balanced diet eating 3 meals a day and 2 snacks. Patient reports that she is staying active exercising daily and reports that with her second jo at [employer] she walks daily, patient does report that sleep patterns have been poor.   Not sleeping well, has mood swings that are mild, feels depressed at times.  Denies any suicidal ideations or intents.  Patient  denies any fever, body aches,chills, rash, chest pain, shortness of breath, nausea, vomiting, or diarrhea.   Denies dizziness, lightheadedness, pre syncopal or syncopal episodes.    Depression screen [facility] 2/9 06/07/2020  Decreased Interest 0  Down, Depressed, Hopeless 0  PHQ - 2 Score 0  Altered sleeping 0  Tired, decreased energy 0  Change in appetite 0  Feeling bad or failure about yourself  0  Trouble concentrating 0  Moving slowly or fidgety/restless 0  Suicidal thoughts 0  PHQ-9 Score 0  Difficult doing work/chores Not difficult at all       Past Medical History:  Diagnosis Date  . Calculus of kidney   . Chlamydia   . Polycystic ovaries    Past Surgical History:  Procedure Laterality Date  . CESAREAN SECTION    . CHOLECYSTECTOMY, LAPAROSCOPIC    . WISDOM TOOTH EXTRACTION     Family Status  Relation Name Status  . Mother  (Not Specified)  . Father  (Not Specified)  . Mat Aunt  (Not Specified)   Family History  Problem Relation Age of Onset  . Hypertension Mother   . Hypertension Father   . Breast cancer Maternal Aunt    Social History   Socioeconomic History  . Marital status: Married    Spouse name: Not on file  . Number of children: Not on file  . Years of education: Not on file  . Highest education level: Not on file  Occupational History  . Not on file  Tobacco Use  . Smoking status: Never Smoker  . Smokeless tobacco: Never Used  Substance and Sexual Activity  . Alcohol use: Yes    Alcohol/week: 1.0 standard drink    Types: 1 Glasses of wine per week  . Drug use: Not on file  . Sexual activity: Yes    Partners: Male    Birth control/protection: I.U.D.  Other Topics Concern  . Not on file  Social History Narrative  . Not on file   Social  Determinants of Health   Financial Resource Strain:   . Difficulty of Paying Living Expenses: Not on file  Food Insecurity:   . Worried About Programme researcher, broadcasting/film/video in the Last Year: Not on file  . Ran Out of Food in the Last Year: Not on file  Transportation Needs:   . Lack of Transportation (Medical): Not on file  . Lack of Transportation (Non-Medical): Not on file  Physical Activity:   . Days of Exercise per Week: Not on file  . Minutes of Exercise per Session: Not on file  Stress:   . Feeling of Stress : Not on file  Social Connections:   . Frequency of Communication with Friends and Family: Not on file  . Frequency of Social Gatherings with Friends and Family: Not on file  . Attends Religious Services: Not on file  . Active Member of  Clubs or Organizations: Not on file  . Attends Banker Meetings: Not on file  . Marital Status: Not on file   Outpatient Medications Prior to Visit  Medication Sig  . levonorgestrel (MIRENA) 20 MCG/24HR IUD 1 each by Intrauterine route once.  . [DISCONTINUED] ibuprofen (ADVIL,MOTRIN) 100 MG tablet Take 100 mg by mouth every 6 (six) hours as needed for fever.   No facility-administered medications prior to visit.   No Known Allergies   There is no immunization history on file for this patient.  Health Maintenance  Topic Date Due  . Hepatitis C Screening  Never done  . COVID-19 Vaccine (1) Never done  . HIV Screening  Never done  . TETANUS/TDAP  Never done  . PAP-Cervical Cytology Screening  Never done  . PAP SMEAR-Modifier  Never done  . INFLUENZA VACCINE  Never done    Patient Care Team: Gil Ingwersen, [name], FNP as PCP - General (Family Medicine)  Review of Systems  Constitutional: Positive for fatigue and unexpected weight change (gsin reported. ). Negative for activity change, appetite change, chills, diaphoresis and fever.  HENT: Negative.   Eyes: Negative.   Respiratory: Negative.   Cardiovascular: Negative.   Gastrointestinal: Negative.   Genitourinary: Negative.   Musculoskeletal: Negative.   Skin: Negative for rash and wound.  Neurological: Negative.   Hematological: Negative.   Psychiatric/Behavioral: Positive for sleep disturbance. Negative for agitation, behavioral problems, confusion, decreased concentration, dysphoric mood, hallucinations, self-injury and suicidal ideas. The patient is nervous/anxious. The patient is not hyperactive.   All other systems reviewed and are negative.     Objective    BP 119/80   Pulse 93   Temp 98.3 F (36.8 C) (Oral)   Resp 16   Ht  (1.549 m)   Wt 238 lb 3.2 oz (108 kg)   SpO2 99%   BMI 45.01 kg/m  Physical Exam Vitals reviewed.  Constitutional:      General: She is not in acute distress.     Appearance: She is well-developed. She is not diaphoretic.     Interventions: She is not intubated.    Comments: Patient is alert and oriented and responsive to questions Engages in eye contact with provider. Speaks in full sentences without any pauses without any shortness of breath or distress.    HENT:     Head: Normocephalic and atraumatic.     Right Ear: External ear normal. There is no impacted cerumen.     Left Ear: External ear normal. There is no impacted cerumen.     Nose: Nose normal. No congestion or rhinorrhea.  Mouth/Throat:     Mouth: Mucous membranes are moist.     Pharynx: No oropharyngeal exudate or posterior oropharyngeal erythema.  Eyes:     General: Lids are normal. No scleral icterus.       Right eye: No discharge.        Left eye: No discharge.     Extraocular Movements: Extraocular movements intact.     Conjunctiva/sclera: Conjunctivae normal.     Right eye: Right conjunctiva is not injected. No exudate or hemorrhage.    Left eye: Left conjunctiva is not injected. No exudate or hemorrhage.    Pupils: Pupils are equal, round, and reactive to light.  Neck:     Thyroid: No thyroid mass or thyromegaly.     Vascular: Normal carotid pulses. No carotid bruit, hepatojugular reflux or JVD.     Trachea: Trachea and phonation normal. No tracheal tenderness or tracheal deviation.     Meningeal: Brudzinski's sign and Kernig's sign absent.  Cardiovascular:     Rate and Rhythm: Normal rate and regular rhythm.     Pulses: Normal pulses.          Radial pulses are 2+ on the right side and 2+ on the left side.       Dorsalis pedis pulses are 2+ on the right side and 2+ on the left side.       Posterior tibial pulses are 2+ on the right side and 2+ on the left side.     Heart sounds: Normal heart sounds, S1 normal and S2 normal. Heart sounds not distant. No murmur heard.  No friction rub. No gallop.   Pulmonary:     Effort: Pulmonary effort is normal. No tachypnea,  bradypnea, accessory muscle usage or respiratory distress. She is not intubated.     Breath sounds: Normal breath sounds. No stridor. No wheezing, rhonchi or rales.  Chest:     Chest wall: No tenderness.  Abdominal:     General: Bowel sounds are normal. There is no distension or abdominal bruit.     Palpations: Abdomen is soft. There is no shifting dullness, fluid wave, hepatomegaly, splenomegaly, mass or pulsatile mass.     Tenderness: There is no abdominal tenderness. There is no right CVA tenderness, left CVA tenderness, guarding or rebound.     Hernia: No hernia is present.  Musculoskeletal:        General: No swelling, tenderness, deformity or signs of injury. Normal range of motion.     Cervical back: Full passive range of motion without pain, normal range of motion and neck supple. No edema, erythema or rigidity. No spinous process tenderness or muscular tenderness. Normal range of motion.     Right lower leg: No edema.     Left lower leg: No edema.  Lymphadenopathy:     Head:     Right side of head: No submental, submandibular, tonsillar, preauricular, posterior auricular or occipital adenopathy.     Left side of head: No submental, submandibular, tonsillar, preauricular, posterior auricular or occipital adenopathy.     Cervical: No cervical adenopathy.     Right cervical: No superficial, deep or posterior cervical adenopathy.    Left cervical: No superficial, deep or posterior cervical adenopathy.     Upper Body:     Right upper body: No supraclavicular or pectoral adenopathy.     Left upper body: No supraclavicular or pectoral adenopathy.  Skin:    General: Skin is warm and dry.     Capillary Refill:  Capillary refill takes less than 2 seconds.     Coloration: Skin is not jaundiced or pale.     Findings: No abrasion, bruising, burn, ecchymosis, erythema, lesion, petechiae or rash.     Nails: There is no clubbing.  Neurological:     Mental Status: She is alert and oriented to  person, place, and time.     GCS: GCS eye subscore is 4. GCS verbal subscore is 5. GCS motor subscore is 6.     Cranial Nerves: No cranial nerve deficit.     Sensory: No sensory deficit.     Motor: No weakness, tremor, atrophy, abnormal muscle tone or seizure activity.     Coordination: Coordination normal.     Gait: Gait normal.     Deep Tendon Reflexes: Reflexes are normal and symmetric. Reflexes normal. Babinski sign absent on the right side. Babinski sign absent on the left side.     Reflex Scores:      Tricep reflexes are 2+ on the right side and 2+ on the left side.      Bicep reflexes are 2+ on the right side and 2+ on the left side.      Brachioradialis reflexes are 2+ on the right side and 2+ on the left side.      Patellar reflexes are 2+ on the right side and 2+ on the left side.      Achilles reflexes are 2+ on the right side and 2+ on the left side. Psychiatric:        Mood and Affect: Mood normal.        Speech: Speech normal.        Behavior: Behavior normal.        Thought Content: Thought content normal.        Judgment: Judgment normal.     Depression Screen PHQ 2/9 Scores 06/07/2020  PHQ - 2 Score 0  PHQ- 9 Score 0   Results for orders placed or performed in visit on 06/07/20  POCT urinalysis dipstick  Result Value Ref Range   Color, UA yellow    Clarity, UA clear    Glucose, UA Negative Negative   Bilirubin, UA negative    Ketones, UA negative    Spec Grav, UA 1.010 1.010 - 1.025   Blood, UA non hemolyzed trace    pH, UA 6.5 5.0 - 8.0   Protein, UA Negative Negative   Urobilinogen, UA 0.2 0.2 or 1.0 E.U./dL   Nitrite, UA negative    Leukocytes, UA Negative Negative   Appearance     Odor    POCT urine pregnancy  Result Value Ref Range   Preg Test, Ur Negative Negative    Assessment & Plan       Body mass index (BMI) of 45.0-49.9 in adult [facility])  IUD (intrauterine device) in place - Plan: POCT urine pregnancy, Ambulatory referral to  Gynecology  Weight gain - Plan: CBC with Differential/Platelet, Comprehensive metabolic panel, Lipid panel, TSH  Vitamin D deficiency - Plan: VITAMIN D 25 Hydroxy (Vit-D Deficiency, Fractures)  Other fatigue - Plan: CBC with Differential/Platelet, Comprehensive metabolic panel, Lipid panel, TSH  Screening for blood or protein in urine - Plan: POCT urinalysis dipstick  Anxiety  Screening examination for STD (sexually transmitted disease) - Plan: HIV antibody (with reflex), RPR, Hepatitis panel, acute  Meds ordered this encounter  Medications  . buPROPion (WELLBUTRIN XL) 300 MG 24 hr tablet    Sig: Take 1 tablet (300 mg total)  by mouth daily.    Dispense:  90 tablet    Refill:  0  Take every other day for first 7 days.  Discussed known black box warning for anti depression/ anxiety medication. Need to report any behavioral changes right, if any homicidal or suicidal thoughts or ideas seek medical attention right away. Call 911.   Fasting labs ordered.   Orders Placed This Encounter  Procedures  . CBC with Differential/Platelet  . Comprehensive metabolic panel  . Lipid panel  . TSH  . VITAMIN D 25 Hydroxy (Vit-D Deficiency, Fractures)  . HIV antibody (with reflex)  . RPR  . Hepatitis panel, acute  . Ambulatory referral to Gynecology  . POCT urinalysis dipstick  . POCT urine pregnancy   Needs PHQ 9 and GAD 7 at next visit for follow up on wellbutrin. Call if having side effects. She will see gynecology to discuss IUD removal and possible wanting to concieve in future. She may want PAP and cervical STD screening at next viist if she has not seen gynecology at that follow up yet.    Red Flags discussed. The patient was given clear instructions to go to ER or return to medical center if any red flags develop, symptoms do not improve, worsen or new problems develop. They verbalized understanding.  Return in about 1 month (around 07/08/2020), or if symptoms worsen or fail to improve,  for at any time for any worsening symptoms, Go to Emergency room/ urgent care if worse.        Jairo BenMichelle Smith Vincenzina Jagoda, FNP  [facility] [phone] (phone) [phone] (fax)  [facility]

## 2020-06-07 NOTE — Patient Instructions (Addendum)
Health Maintenance, Female Adopting a healthy lifestyle and getting preventive care are important in promoting health and wellness. Ask your health care provider about:  The right schedule for you to have regular tests and exams.  Things you can do on your own to prevent diseases and keep yourself healthy. What should I know about diet, weight, and exercise? Eat a healthy diet   Eat a diet that includes plenty of vegetables, fruits, low-fat dairy products, and lean protein.  Do not eat a lot of foods that are high in solid fats, added sugars, or sodium. Maintain a healthy weight Body mass index (BMI) is used to identify weight problems. It estimates body fat based on height and weight. Your health care provider can help determine your BMI and help you achieve or maintain a healthy weight. Get regular exercise Get regular exercise. This is one of the most important things you can do for your health. Most adults should:  Exercise for at least 150 minutes each week. The exercise should increase your heart rate and make you sweat (moderate-intensity exercise).  Do strengthening exercises at least twice a week. This is in addition to the moderate-intensity exercise.  Spend less time sitting. Even light physical activity can be beneficial. Watch cholesterol and blood lipids Have your blood tested for lipids and cholesterol at 28 years of age, then have this test every 5 years. Have your cholesterol levels checked more often if:  Your lipid or cholesterol levels are high.  You are older than 28 years of age.  You are at high risk for heart disease. What should I know about cancer screening? Depending on your health history and family history, you may need to have cancer screening at various ages. This may include screening for:  Breast cancer.  Cervical cancer.  Colorectal cancer.  Skin cancer.  Lung cancer. What should I know about heart disease, diabetes, and high blood  pressure? Blood pressure and heart disease  High blood pressure causes heart disease and increases the risk of stroke. This is more likely to develop in people who have high blood pressure readings, are of African descent, or are overweight.  Have your blood pressure checked: ? Every 3-5 years if you are 18-39 years of age. ? Every year if you are 40 years old or older. Diabetes Have regular diabetes screenings. This checks your fasting blood sugar level. Have the screening done:  Once every three years after age 40 if you are at a normal weight and have a low risk for diabetes.  More often and at a younger age if you are overweight or have a high risk for diabetes. What should I know about preventing infection? Hepatitis B If you have a higher risk for hepatitis B, you should be screened for this virus. Talk with your health care provider to find out if you are at risk for hepatitis B infection. Hepatitis C Testing is recommended for:  Everyone born from 1945 through 1965.  Anyone with known risk factors for hepatitis C. Sexually transmitted infections (STIs)  Get screened for STIs, including gonorrhea and chlamydia, if: ? You are sexually active and are younger than 28 years of age. ? You are older than 28 years of age and your health care provider tells you that you are at risk for this type of infection. ? Your sexual activity has changed since you were last screened, and you are at increased risk for chlamydia or gonorrhea. Ask your health care provider if   you are at risk.  Ask your health care provider about whether you are at high risk for HIV. Your health care provider may recommend a prescription medicine to help prevent HIV infection. If you choose to take medicine to prevent HIV, you should first get tested for HIV. You should then be tested every 3 months for as long as you are taking the medicine. Pregnancy  If you are about to stop having your period (premenopausal) and  you may become pregnant, seek counseling before you get pregnant.  Take 400 to 800 micrograms (mcg) of folic acid every day if you become pregnant.  Ask for birth control (contraception) if you want to prevent pregnancy. Osteoporosis and menopause Osteoporosis is a disease in which the bones lose minerals and strength with aging. This can result in bone fractures. If you are 52 years old or older, or if you are at risk for osteoporosis and fractures, ask your health care provider if you should:  Be screened for bone loss.  Take a calcium or vitamin D supplement to lower your risk of fractures.  Be given hormone replacement therapy (HRT) to treat symptoms of menopause. Follow these instructions at home: Lifestyle  Do not use any products that contain nicotine or tobacco, such as cigarettes, e-cigarettes, and chewing tobacco. If you need help quitting, ask your health care provider.  Do not use street drugs.  Do not share needles.  Ask your health care provider for help if you need support or information about quitting drugs. Alcohol use  Do not drink alcohol if: ? Your health care provider tells you not to drink. ? You are pregnant, may be pregnant, or are planning to become pregnant.  If you drink alcohol: ? Limit how much you use to 0-1 drink a day. ? Limit intake if you are breastfeeding.  Be aware of how much alcohol is in your drink. In the [place] one drink equals one 12 oz bottle of beer (355 mL), one 5 oz glass of wine (148 mL), or one 1 oz glass of hard liquor (44 mL). General instructions  Schedule regular health, dental, and eye exams.  Stay current with your vaccines.  Tell your health care provider if: ? You often feel depressed. ? You have ever been abused or do not feel safe at home. Summary  Adopting a healthy lifestyle and getting preventive care are important in promoting health and wellness.  Follow your health care provider's instructions about healthy  diet, exercising, and getting tested or screened for diseases.  Follow your health care provider's instructions on monitoring your cholesterol and blood pressure. This information is not intended to replace advice given to you by your health care provider. Make sure you discuss any questions you have with your health care provider. Document Revised: 06/11/2018 Document Reviewed: 06/11/2018 Elsevier Patient Education  2020 [employer] Bupropion extended-release tablets (Depression/Mood Disorders) What is this medicine? BUPROPION (byoo PROE pee on) is used to treat depression. This medicine may be used for other purposes; ask your health care provider or pharmacist if you have questions. COMMON BRAND NAME(S): Aplenzin, Budeprion XL, Forfivo XL, Wellbutrin XL What should I tell my health care provider before I take this medicine? They need to know if you have any of these conditions:  an eating disorder, such as anorexia or bulimia  bipolar disorder or psychosis  diabetes or high blood sugar, treated with medication  glaucoma  head injury or brain tumor  heart disease, previous heart attack,  or irregular heart beat  high blood pressure  kidney or liver disease  seizures (convulsions)  suicidal thoughts or a previous suicide attempt  Tourette's syndrome  weight loss  an unusual or allergic reaction to bupropion, other medicines, foods, dyes, or preservatives  breast-feeding  pregnant or trying to become pregnant How should I use this medicine? Take this medicine by mouth with a glass of water. Follow the directions on the prescription label. You can take it with or without food. If it upsets your stomach, take it with food. Do not crush, chew, or cut these tablets. This medicine is taken once daily at the same time each day. Do not take your medicine more often than directed. Do not stop taking this medicine suddenly except upon the advice of your doctor. Stopping this  medicine too quickly may cause serious side effects or your condition may worsen. A special MedGuide will be given to you by the pharmacist with each prescription and refill. Be sure to read this information carefully each time. Talk to your pediatrician regarding the use of this medicine in children. Special care may be needed. Overdosage: If you think you have taken too much of this medicine contact a poison control center or emergency room at once. NOTE: This medicine is only for you. Do not share this medicine with others. What if I miss a dose? If you miss a dose, skip the missed dose and take your next tablet at the regular time. Do not take double or extra doses. What may interact with this medicine? Do not take this medicine with any of the following medications:  linezolid  MAOIs like Azilect, Carbex, Eldepryl, Marplan, Nardil, and Parnate  methylene blue (injected into a vein)  other medicines that contain bupropion like Zyban This medicine may also interact with the following medications:  alcohol  certain medicines for anxiety or sleep  certain medicines for blood pressure like metoprolol, propranolol  certain medicines for depression or psychotic disturbances  certain medicines for HIV or AIDS like efavirenz, lopinavir, nelfinavir, ritonavir  certain medicines for irregular heart beat like propafenone, flecainide  certain medicines for Parkinson's disease like amantadine, levodopa  certain medicines for seizures like carbamazepine, phenytoin, phenobarbital  cimetidine  clopidogrel  cyclophosphamide  digoxin  furazolidone  isoniazid  nicotine  orphenadrine  procarbazine  steroid medicines like prednisone or cortisone  stimulant medicines for attention disorders, weight loss, or to stay awake  tamoxifen  theophylline  thiotepa  ticlopidine  tramadol  warfarin This list may not describe all possible interactions. Give your health care  provider a list of all the medicines, herbs, non-prescription drugs, or dietary supplements you use. Also tell them if you smoke, drink alcohol, or use illegal drugs. Some items may interact with your medicine. What should I watch for while using this medicine? Tell your doctor if your symptoms do not get better or if they get worse. Visit your doctor or healthcare provider for regular checks on your progress. Because it may take several weeks to see the full effects of this medicine, it is important to continue your treatment as prescribed by your doctor. This medicine may cause serious skin reactions. They can happen weeks to months after starting the medicine. Contact your healthcare provider right away if you notice fevers or flu-like symptoms with a rash. The rash may be red or purple and then turn into blisters or peeling of the skin. Or, you might notice a red rash with swelling of the face,  lips or lymph nodes in your neck or under your arms. Patients and their families should watch out for new or worsening thoughts of suicide or depression. Also watch out for sudden changes in feelings such as feeling anxious, agitated, panicky, irritable, hostile, aggressive, impulsive, severely restless, overly excited and hyperactive, or not being able to sleep. If this happens, especially at the beginning of treatment or after a change in dose, call your healthcare provider. Avoid alcoholic drinks while taking this medicine. Drinking large amounts of alcoholic beverages, using sleeping or anxiety medicines, or quickly stopping the use of these agents while taking this medicine may increase your risk for a seizure. Do not drive or use heavy machinery until you know how this medicine affects you. This medicine can impair your ability to perform these tasks. Do not take this medicine close to bedtime. It may prevent you from sleeping. Your mouth may get dry. Chewing sugarless gum or sucking hard candy, and drinking  plenty of water may help. Contact your doctor if the problem does not go away or is severe. The tablet shell for some brands of this medicine does not dissolve. This is normal. The tablet shell may appear whole in the stool. This is not a cause for concern. What side effects may I notice from receiving this medicine? Side effects that you should report to your doctor or health care professional as soon as possible:  allergic reactions like skin rash, itching or hives, swelling of the face, lips, or tongue  breathing problems  changes in vision  confusion  elevated mood, decreased need for sleep, racing thoughts, impulsive behavior  fast or irregular heartbeat  hallucinations, loss of contact with reality  increased blood pressure  rash, fever, and swollen lymph nodes  redness, blistering, peeling or loosening of the skin, including inside the mouth  seizures  suicidal thoughts or other mood changes  unusually weak or tired  vomiting Side effects that usually do not require medical attention (report to your doctor or health care professional if they continue or are bothersome):  constipation  headache  loss of appetite  nausea  tremors  weight loss This list may not describe all possible side effects. Call your doctor for medical advice about side effects. You may report side effects to FDA at 1-800-FDA-1088. Where should I keep my medicine? Keep out of the reach of children. Store at room temperature between 15 and 30 degrees C (59 and 86 degrees F). Throw away any unused medicine after the expiration date. NOTE: This sheet is a summary. It may not cover all possible information. If you have questions about this medicine, talk to your doctor, pharmacist, or health care provider.  2020 Elsevier/Gold Standard (2018-09-11 13:45:31)   Calorie Counting for Weight Loss Calories are units of energy. Your body needs a certain amount of calories from food to keep you  going throughout the day. When you eat more calories than your body needs, your body stores the extra calories as fat. When you eat fewer calories than your body needs, your body burns fat to get the energy it needs. Calorie counting means keeping track of how many calories you eat and drink each day. Calorie counting can be helpful if you need to lose weight. If you make sure to eat fewer calories than your body needs, you should lose weight. Ask your health care provider what a healthy weight is for you. For calorie counting to work, you will need to eat the right  number of calories in a day in order to lose a healthy amount of weight per week. A dietitian can help you determine how many calories you need in a day and will give you suggestions on how to reach your calorie goal.  A healthy amount of weight to lose per week is usually 1-2 lb (0.5-0.9 kg). This usually means that your daily calorie intake should be reduced by 500-750 calories.  Eating 1,200 - 1,500 calories per day can help most women lose weight.  Eating 1,500 - 1,800 calories per day can help most men lose weight. What is my plan? My goal is to have __________ calories per day. If I have this many calories per day, I should lose around __________ pounds per week. What do I need to know about calorie counting? In order to meet your daily calorie goal, you will need to:  Find out how many calories are in each food you would like to eat. Try to do this before you eat.  Decide how much of the food you plan to eat.  Write down what you ate and how many calories it had. Doing this is called keeping a food log. To successfully lose weight, it is important to balance calorie counting with a healthy lifestyle that includes regular activity. Aim for 150 minutes of moderate exercise (such as walking) or 75 minutes of vigorous exercise (such as running) each week. Where do I find calorie information?  The number of calories in a food can  be found on a Nutrition Facts label. If a food does not have a Nutrition Facts label, try to look up the calories online or ask your dietitian for help. Remember that calories are listed per serving. If you choose to have more than one serving of a food, you will have to multiply the calories per serving by the amount of servings you plan to eat. For example, the label on a package of bread might say that a serving size is 1 slice and that there are 90 calories in a serving. If you eat 1 slice, you will have eaten 90 calories. If you eat 2 slices, you will have eaten 180 calories. How do I keep a food log? Immediately after each meal, record the following information in your food log:  What you ate. Don't forget to include toppings, sauces, and other extras on the food.  How much you ate. This can be measured in cups, ounces, or number of items.  How many calories each food and drink had.  The total number of calories in the meal. Keep your food log near you, such as in a small notebook in your pocket, or use a mobile app or website. Some programs will calculate calories for you and show you how many calories you have left for the day to meet your goal. What are some calorie counting tips?   Use your calories on foods and drinks that will fill you up and not leave you hungry: ? Some examples of foods that fill you up are nuts and nut butters, vegetables, lean proteins, and high-fiber foods like whole grains. High-fiber foods are foods with more than 5 g fiber per serving. ? Drinks such as sodas, specialty coffee drinks, alcohol, and juices have a lot of calories, yet do not fill you up.  Eat nutritious foods and avoid empty calories. Empty calories are calories you get from foods or beverages that do not have many vitamins or protein, such as  candy, sweets, and soda. It is better to have a nutritious high-calorie food (such as an avocado) than a food with few nutrients (such as a bag of  chips).  Know how many calories are in the foods you eat most often. This will help you calculate calorie counts faster.  Pay attention to calories in drinks. Low-calorie drinks include water and unsweetened drinks.  Pay attention to nutrition labels for "low fat" or "fat free" foods. These foods sometimes have the same amount of calories or more calories than the full fat versions. They also often have added sugar, starch, or salt, to make up for flavor that was removed with the fat.  Find a way of tracking calories that works for you. Get creative. Try different apps or programs if writing down calories does not work for you. What are some portion control tips?  Know how many calories are in a serving. This will help you know how many servings of a certain food you can have.  Use a measuring cup to measure serving sizes. You could also try weighing out portions on a kitchen scale. With time, you will be able to estimate serving sizes for some foods.  Take some time to put servings of different foods on your favorite plates, bowls, and cups so you know what a serving looks like.  Try not to eat straight from a bag or box. Doing this can lead to overeating. Put the amount you would like to eat in a cup or on a plate to make sure you are eating the right portion.  Use smaller plates, glasses, and bowls to prevent overeating.  Try not to multitask (for example, watch TV or use your computer) while eating. If it is time to eat, sit down at a table and enjoy your food. This will help you to know when you are full. It will also help you to be aware of what you are eating and how much you are eating. What are tips for following this plan? Reading food labels  Check the calorie count compared to the serving size. The serving size may be smaller than what you are used to eating.  Check the source of the calories. Make sure the food you are eating is high in vitamins and protein and low in  saturated and trans fats. Shopping  Read nutrition labels while you shop. This will help you make healthy decisions before you decide to purchase your food.  Make a grocery list and stick to it. Cooking  Try to cook your favorite foods in a healthier way. For example, try baking instead of frying.  Use low-fat dairy products. Meal planning  Use more fruits and vegetables. Half of your plate should be fruits and vegetables.  Include lean proteins like poultry and fish. How do I count calories when eating out?  Ask for smaller portion sizes.  Consider sharing an entree and sides instead of getting your own entree.  If you get your own entree, eat only half. Ask for a box at the beginning of your meal and put the rest of your entree in it so you are not tempted to eat it.  If calories are listed on the menu, choose the lower calorie options.  Choose dishes that include vegetables, fruits, whole grains, low-fat dairy products, and lean protein.  Choose items that are boiled, broiled, grilled, or steamed. Stay away from items that are buttered, battered, fried, or served with cream sauce. Items labeled "crispy"  are usually fried, unless stated otherwise.  Choose water, low-fat milk, unsweetened iced tea, or other drinks without added sugar. If you want an alcoholic beverage, choose a lower calorie option such as a glass of wine or light beer.  Ask for dressings, sauces, and syrups on the side. These are usually high in calories, so you should limit the amount you eat.  If you want a salad, choose a garden salad and ask for grilled meats. Avoid extra toppings like bacon, cheese, or fried items. Ask for the dressing on the side, or ask for olive oil and vinegar or lemon to use as dressing.  Estimate how many servings of a food you are given. For example, a serving of cooked rice is  cup or about the size of half a baseball. Knowing serving sizes will help you be aware of how much food  you are eating at restaurants. The list below tells you how big or small some common portion sizes are based on everyday objects: ? 1 oz--4 stacked dice. ? 3 oz--1 deck of cards. ? 1 tsp--1 die. ? 1 Tbsp-- a ping-pong ball. ? 2 Tbsp--1 ping-pong ball. ?  cup-- baseball. ? 1 cup--1 baseball. Summary  Calorie counting means keeping track of how many calories you eat and drink each day. If you eat fewer calories than your body needs, you should lose weight.  A healthy amount of weight to lose per week is usually 1-2 lb (0.5-0.9 kg). This usually means reducing your daily calorie intake by 500-750 calories.  The number of calories in a food can be found on a Nutrition Facts label. If a food does not have a Nutrition Facts label, try to look up the calories online or ask your dietitian for help.  Use your calories on foods and drinks that will fill you up, and not on foods and drinks that will leave you hungry.  Use smaller plates, glasses, and bowls to prevent overeating. This information is not intended to replace advice given to you by your health care provider. Make sure you discuss any questions you have with your health care provider. Document Revised: 03/07/2018 Document Reviewed: 05/18/2016 Elsevier Patient Education  2020 [employer]   Fat and Cholesterol Restricted Eating Plan Getting too much fat and cholesterol in your diet may cause health problems. Choosing the right foods helps keep your fat and cholesterol at normal levels. This can keep you from getting certain diseases. Your doctor may recommend an eating plan that includes:  Total fat: ______% or less of total calories a day.  Saturated fat: ______% or less of total calories a day.  Cholesterol: less than _________mg a day.  Fiber: ______g a day. What are tips for following this plan? Meal planning  At meals, divide your plate into four equal parts: ? Fill one-half of your plate with vegetables and green  salads. ? Fill one-fourth of your plate with whole grains. ? Fill one-fourth of your plate with low-fat (lean) protein foods.  Eat fish that is high in omega-3 fats at least two times a week. This includes mackerel, tuna, sardines, and salmon.  Eat foods that are high in fiber, such as whole grains, beans, apples, broccoli, carrots, peas, and barley. General tips   Work with your doctor to lose weight if you need to.  Avoid: ? Foods with added sugar. ? Fried foods. ? Foods with partially hydrogenated oils.  Limit alcohol intake to no more than 1 drink a day for  nonpregnant women and 2 drinks a day for men. One drink equals 12 oz of beer, 5 oz of wine, or 1 oz of hard liquor. Reading food labels  Check food labels for: ? Trans fats. ? Partially hydrogenated oils. ? Saturated fat (g) in each serving. ? Cholesterol (mg) in each serving. ? Fiber (g) in each serving.  Choose foods with healthy fats, such as: ? Monounsaturated fats. ? Polyunsaturated fats. ? Omega-3 fats.  Choose grain products that have whole grains. Look for the word "whole" as the first word in the ingredient list. Cooking  Cook foods using low-fat methods. These include baking, boiling, grilling, and broiling.  Eat more home-cooked foods. Eat at restaurants and buffets less often.  Avoid cooking using saturated fats, such as butter, cream, palm oil, palm kernel oil, and coconut oil. Recommended foods  Fruits  All fresh, canned (in natural juice), or frozen fruits. Vegetables  Fresh or frozen vegetables (raw, steamed, roasted, or grilled). Green salads. Grains  Whole grains, such as whole wheat or whole grain breads, crackers, cereals, and pasta. Unsweetened oatmeal, bulgur, barley, quinoa, or brown rice. Corn or whole wheat flour tortillas. Meats and other protein foods  Ground beef (85% or leaner), grass-fed beef, or beef trimmed of fat. Skinless chicken or [place]. Ground chicken or [place]. Pork  trimmed of fat. All fish and seafood. Egg whites. Dried beans, peas, or lentils. Unsalted nuts or seeds. Unsalted canned beans. Nut butters without added sugar or oil. Dairy  Low-fat or nonfat dairy products, such as skim or 1% milk, 2% or reduced-fat cheeses, low-fat and fat-free ricotta or cottage cheese, or plain low-fat and nonfat yogurt. Fats and oils  Tub margarine without trans fats. Light or reduced-fat mayonnaise and salad dressings. Avocado. Olive, canola, sesame, or safflower oils. The items listed above may not be a complete list of foods and beverages you can eat. Contact a dietitian for more information. Foods to avoid Fruits  Canned fruit in heavy syrup. Fruit in cream or butter sauce. Fried fruit. Vegetables  Vegetables cooked in cheese, cream, or butter sauce. Fried vegetables. Grains  White bread. White pasta. White rice. Cornbread. Bagels, pastries, and croissants. Crackers and snack foods that contain trans fat and hydrogenated oils. Meats and other protein foods  Fatty cuts of meat. Ribs, chicken wings, bacon, sausage, bologna, salami, chitterlings, fatback, hot dogs, bratwurst, and packaged lunch meats. Liver and organ meats. Whole eggs and egg yolks. Chicken and [place] with skin. Fried meat. Dairy  Whole or 2% milk, cream, half-and-half, and cream cheese. Whole milk cheeses. Whole-fat or sweetened yogurt. Full-fat cheeses. Nondairy creamers and whipped toppings. Processed cheese, cheese spreads, and cheese curds. Beverages  Alcohol. Sugar-sweetened drinks such as sodas, lemonade, and fruit drinks. Fats and oils  Butter, stick margarine, lard, shortening, ghee, or bacon fat. Coconut, palm kernel, and palm oils. Sweets and desserts  Corn syrup, sugars, honey, and molasses. Candy. Jam and jelly. Syrup. Sweetened cereals. Cookies, pies, cakes, donuts, muffins, and ice cream. The items listed above may not be a complete list of foods and beverages you should avoid.  Contact a dietitian for more information. Summary  Choosing the right foods helps keep your fat and cholesterol at normal levels. This can keep you from getting certain diseases.  At meals, fill one-half of your plate with vegetables and green salads.  Eat high-fiber foods, like whole grains, beans, apples, carrots, peas, and barley.  Limit added sugar, saturated fats, alcohol, and fried foods. This information  is not intended to replace advice given to you by your health care provider. Make sure you discuss any questions you have with your health care provider. Document Revised: 02/19/2018 Document Reviewed: 03/05/2017 Elsevier Patient Education  2020 [employer].

## 2020-06-07 NOTE — Progress Notes (Signed)
Noted we can just recheck at follow up.

## 2020-06-27 ENCOUNTER — Other Ambulatory Visit: Payer: Self-pay

## 2020-06-27 ENCOUNTER — Encounter: Payer: Self-pay | Admitting: Obstetrics and Gynecology

## 2020-06-27 ENCOUNTER — Other Ambulatory Visit (HOSPITAL_COMMUNITY)
Admission: RE | Admit: 2020-06-27 | Discharge: 2020-06-27 | Disposition: A | Discharge status: Home / Self Care | Payer: BC Managed Care – PPO | Source: Ambulatory Visit | Attending: Obstetrics and Gynecology | Admitting: Obstetrics and Gynecology

## 2020-06-27 ENCOUNTER — Ambulatory Visit (INDEPENDENT_AMBULATORY_CARE_PROVIDER_SITE_OTHER): Payer: BC Managed Care – PPO | Admitting: Obstetrics and Gynecology

## 2020-06-27 VITALS — BP 120/80 | HR 98 | Ht 61.0 in | Wt 230.0 lb

## 2020-06-27 DIAGNOSIS — Z124 Encounter for screening for malignant neoplasm of cervix: Secondary | ICD-10-CM | POA: Diagnosis not present

## 2020-06-27 DIAGNOSIS — Z30011 Encounter for initial prescription of contraceptive pills: Secondary | ICD-10-CM | POA: Diagnosis not present

## 2020-06-27 DIAGNOSIS — Z113 Encounter for screening for infections with a predominantly sexual mode of transmission: Secondary | ICD-10-CM

## 2020-06-27 DIAGNOSIS — Z1151 Encounter for screening for human papillomavirus (HPV): Secondary | ICD-10-CM | POA: Diagnosis not present

## 2020-06-27 DIAGNOSIS — Z30432 Encounter for removal of intrauterine contraceptive device: Secondary | ICD-10-CM | POA: Diagnosis not present

## 2020-06-27 DIAGNOSIS — R87810 Cervical high risk human papillomavirus (HPV) DNA test positive: Secondary | ICD-10-CM | POA: Diagnosis not present

## 2020-06-27 MED ORDER — NORETHIN ACE-ETH ESTRAD-FE 1-20 MG-MCG PO TABS: 1.0000 | ORAL_TABLET | Freq: Every day | ORAL | 11 refills | Status: DC

## 2020-06-27 NOTE — Patient Instructions (Signed)

## 2020-06-27 NOTE — Progress Notes (Signed)
Patient ID: Nancy Ramos, female   DOB: [birth year], 29 y.o.   MRN: [id number]  Reason for Consult: Referral (Mirena removal. Interested in pill)   Referred by Berniece Pap, F*  Subjective:     HPI:  Nancy Ramos is a 29 y.o. female. She would like her IUD removed. She may want to get pregnant soon. She reports if she did she would like to have a repeat cesarean section.   She would like to transition to an OCP at this time. She knows to use a week of back up contraception to avoid pregnancy with the transition of methods.    Past Medical History:  Diagnosis Date  . Calculus of kidney   . Chlamydia   . Polycystic ovaries    Family History  Problem Relation Age of Onset  . Hypertension Mother   . Hypertension Father   . Breast cancer Maternal Aunt    Past Surgical History:  Procedure Laterality Date  . CESAREAN SECTION    . CHOLECYSTECTOMY, LAPAROSCOPIC    . WISDOM TOOTH EXTRACTION      Short Social History:  Social History   Tobacco Use  . Smoking status: Never Smoker  . Smokeless tobacco: Never Used  Substance Use Topics  . Alcohol use: Yes    Alcohol/week: 1.0 standard drink    Types: 1 Glasses of wine per week    No Known Allergies  Current Outpatient Medications  Medication Sig Dispense Refill  . buPROPion (WELLBUTRIN XL) 300 MG 24 hr tablet Take 1 tablet (300 mg total) by mouth daily. 90 tablet 0  . levonorgestrel (MIRENA) 20 MCG/24HR IUD 1 each by Intrauterine route once.    . norethindrone-ethinyl estradiol (JUNEL FE 1/20) 1-20 MG-MCG tablet Take 1 tablet by mouth daily. 28 tablet 11   No current facility-administered medications for this visit.    Review of Systems  Constitutional: Negative for chills, fatigue, fever and unexpected weight change.  HENT: Negative for trouble swallowing.  Eyes: Negative for loss of vision.  Respiratory: Negative for cough, shortness of breath and wheezing.  Cardiovascular: Negative for chest  pain, leg swelling, palpitations and syncope.  GI: Negative for abdominal pain, blood in stool, diarrhea, nausea and vomiting.  GU: Negative for difficulty urinating, dysuria, frequency and hematuria.  Musculoskeletal: Negative for back pain, leg pain and joint pain.  Skin: Negative for rash.  Neurological: Negative for dizziness, headaches, light-headedness, numbness and seizures.  Psychiatric: Negative for behavioral problem, confusion, depressed mood and sleep disturbance.        Objective:  Objective   Vitals:   06/27/20 1455  BP: 120/80  Pulse: 98  Weight: 230 lb (104.3 kg)  Height: 5\' 1"  (1.549 m)   Body mass index is 43.46 kg/m.  Physical Exam Vitals and nursing note reviewed.  Constitutional:      Appearance: She is well-developed and well-nourished.  HENT:     Head: Normocephalic and atraumatic.  Eyes:     Extraocular Movements: EOM normal.     Pupils: Pupils are equal, round, and reactive to light.  Cardiovascular:     Rate and Rhythm: Normal rate and regular rhythm.  Pulmonary:     Effort: Pulmonary effort is normal. No respiratory distress.  Skin:    General: Skin is warm and dry.  Neurological:     Mental Status: She is alert and oriented to person, place, and time.  Psychiatric:        Mood and Affect: Mood and  affect normal.        Behavior: Behavior normal.        Thought Content: Thought content normal.        Judgment: Judgment normal.     BP 120/80 (BP Location: Right Arm, Patient Position: Sitting, Cuff Size: Normal)   Pulse 98   Ht 5\' 1"  (1.549 m)   Wt 230 lb (104.3 kg)   BMI 43.46 kg/m  Body mass index is 43.46 kg/m. Constitutional: Well nourished, well developed female in no acute distress.  Abdomen: diffusely non tender to palpation, non distended, and no masses, hernias Neuro: Grossly intact Psych:  Normal mood and affect.    Pelvic exam:  Two IUD strings present seen coming from the cervical os. EGBUS, vaginal vault and cervix:  within normal limits  IUD Removal Strings of IUD identified and grasped.  IUD removed without problem.  Pt tolerated this well.  IUD noted to be intact.   Assessment/Plan:     29 yo  1. Pap smear update 2. IUD removed. Rx for junel sent.  3. STD testing per patient request.   More than 20 minutes were spent face to face with the patient in the room, reviewing the medical record, labs and images, and coordinating care for the patient. The plan of management was discussed in detail and counseling was provided.    26 MD [facility], [facility] 06/27/2020 3:31 PM

## 2020-06-28 LAB — HEPATITIS PANEL, ACUTE
Hep A IgM: NEGATIVE
Hep B C IgM: NEGATIVE
Hep C Virus Ab: <0.1 s/co ratio (ref 0.0–0.9)
Hepatitis B Surface Ag: NEGATIVE

## 2020-06-28 LAB — HIV ANTIBODY (ROUTINE TESTING W REFLEX): HIV Screen 4th Generation wRfx: NONREACTIVE

## 2020-06-28 LAB — RPR: RPR Ser Ql: NONREACTIVE

## 2020-07-08 ENCOUNTER — Telehealth: Payer: Self-pay

## 2020-07-08 ENCOUNTER — Ambulatory Visit: Payer: Self-pay | Admitting: Adult Health

## 2020-07-08 NOTE — Telephone Encounter (Signed)
Copied from CRM [id number]. 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

## 2020-07-08 NOTE — Telephone Encounter (Signed)
[id number] 

## 2020-07-08 NOTE — Telephone Encounter (Signed)
Okay to place order? KW 

## 2020-07-08 NOTE — Telephone Encounter (Signed)
Ok to order. Seek care immediately if any symptoms or any need for care.

## 2020-07-10 LAB — CYTOLOGY - PAP
Chlamydia: NEGATIVE
Comment: NEGATIVE
Comment: NEGATIVE
Comment: NEGATIVE
Comment: NEGATIVE
Comment: NORMAL
Diagnosis: NEGATIVE
HPV 16: NEGATIVE
HPV 18 / 45: NEGATIVE
High risk HPV: POSITIVE — AB
Neisseria Gonorrhea: NEGATIVE
Trichomonas: NEGATIVE

## 2020-07-12 ENCOUNTER — Other Ambulatory Visit: Payer: BC Managed Care – PPO

## 2020-07-12 ENCOUNTER — Other Ambulatory Visit: Payer: Self-pay

## 2020-07-12 DIAGNOSIS — Z20822 Contact with and (suspected) exposure to covid-19: Secondary | ICD-10-CM

## 2020-07-14 ENCOUNTER — Other Ambulatory Visit: Payer: BC Managed Care – PPO

## 2020-07-14 LAB — SARS-COV-2, NAA 2 DAY TAT

## 2020-07-14 LAB — NOVEL CORONAVIRUS, NAA: SARS-CoV-2, NAA: NOT DETECTED

## 2020-07-18 ENCOUNTER — Ambulatory Visit: Payer: Self-pay | Admitting: Adult Health

## 2020-12-12 ENCOUNTER — Ambulatory Visit (INDEPENDENT_AMBULATORY_CARE_PROVIDER_SITE_OTHER): Payer: BC Managed Care – PPO | Admitting: Obstetrics and Gynecology

## 2020-12-12 ENCOUNTER — Encounter: Payer: Self-pay | Admitting: Obstetrics and Gynecology

## 2020-12-12 ENCOUNTER — Other Ambulatory Visit: Payer: Self-pay

## 2020-12-12 VITALS — BP 132/76 | Ht 61.0 in | Wt 225.0 lb

## 2020-12-12 DIAGNOSIS — Z3169 Encounter for other general counseling and advice on procreation: Secondary | ICD-10-CM | POA: Diagnosis not present

## 2020-12-12 NOTE — Progress Notes (Signed)
Gynecology H&P  PCP: Berniece Pap, FNP  Chief Complaint:  Chief Complaint  Patient presents with   fertility    History of Present Illness: Patient is a 30 y.o. G1P1001 presenting for evaluation of infertility. Patient and partner have been attempting unprotected intercourse for 6 months and actively trying for conception for 6 months.  Mirena IUD removed 06/27/2020  Pregnancies with current partner no  Sexual History Dyspareunia: no Use of Lubricant: no Douching: no  Ovulatory Evaluation LMP: Patient's last menstrual period was 11/28/2020 (exact date). Interval regular 28  days Duration of flow: 5 days Heavy Menses: no Clots: no Intermenstrual Bleeding: no Dysmenorrhea: no  Molimina yes Ovulation Predictor Kits Positive  no  PCOS  Wt Change: no Hirsutism: no Acne: no  Hyperprolactinemia Galactorrhea: no Headaches: yes Vision Changes:  no  Thyroid Temperature Intolerance: no Constipation or Diarrhea: no Hair Thinning:  no Palpitation:  no  Prior treatments Meds: none Other Therapies: Not applicable  Premature Ovarian Failure Family history of autism, mental retardation, or fragile X: no Family history of premature ovarian failure or early menopause: no Prior radiation or chemotherapy exposoure:  no  Tubal Factor Previous abdominal or pelvic surgery: no Pelvic Pain:  no Endometriosis: no PID: no Laparoscopy: no Prior HSG: no  Female Factor Sired prior conception:  no Semen analysis: no  Contraception None  Review of Systems: 10 point review of systems negative unless otherwise noted in HPI  Past Medical History:  Patient Active Problem List   Diagnosis Date Noted   Other fatigue 06/07/2020   Vitamin D deficiency 06/07/2020   IUD (intrauterine device) in place 06/07/2020    Past Surgical History:  Past Surgical History:  Procedure Laterality Date   CESAREAN SECTION     CHOLECYSTECTOMY, LAPAROSCOPIC     WISDOM TOOTH  EXTRACTION      Family History:  Family History  Problem Relation Age of Onset   Hypertension Mother    Hypertension Father    Breast cancer Maternal Aunt     Social History:  Social History   Socioeconomic History   Marital status: Single    Spouse name: Not on file   Number of children: Not on file   Years of education: Not on file   Highest education level: Not on file  Occupational History   Not on file  Tobacco Use   Smoking status: Never   Smokeless tobacco: Never  Vaping Use   Vaping Use: Never used  Substance and Sexual Activity   Alcohol use: Yes    Alcohol/week: 1.0 standard drink    Types: 1 Glasses of wine per week   Drug use: Never   Sexual activity: Yes    Partners: Male    Birth control/protection: None  Other Topics Concern   Not on file  Social History Narrative   Not on file   Social Determinants of Health   Financial Resource Strain: Not on file  Food Insecurity: Not on file  Transportation Needs: Not on file  Physical Activity: Not on file  Stress: Not on file  Social Connections: Not on file  Intimate Partner Violence: Not on file    Allergies:  No Known Allergies  Medications: Prior to Admission medications   Medication Sig Start Date End Date Taking? Authorizing Provider  buPROPion (WELLBUTRIN XL) 300 MG 24 hr tablet Take 1 tablet (300 mg total) by mouth daily. Patient not taking: Reported on 12/12/2020 06/07/20   Flinchum, [name], FNP  norethindrone-ethinyl  estradiol (JUNEL FE 1/20) 1-20 MG-MCG tablet Take 1 tablet by mouth daily. Patient not taking: Reported on 12/12/2020 06/27/20   Natale Milch, MD    Physical Exam Vitals: Blood pressure 132/76, height 5\' 1"  (1.549 m), weight 225 lb (102.1 kg), last menstrual period 11/28/2020.  General: NAD HEENT: normocephalic, anicteric Pulmonary: No increased work of breathing Neurologic: Grossly intact Psychiatric: mood appropriate, affect full  * No order type specified  *  Assessment: 30 y.o. G1P1001 presenting for initial infertility evaluatoin Plan: 1) We discussed the underlying etiologies which may be implicated in a couple experiencing difficulty conceiving.  The average couple will conceive within the span of 1 year with unprotected coitus, with a monthly fecundity rate of 20% or 1 in 5.  Even without further work up or intervention the patient and her partner may be successful in conceiving unassisted, although if an underlying etiology can be identified and addressed fecundity rate may improve.  The work up entails examining for ovulatory function, tubal patency, and ruling out female factor infertility.  These may be looked at concurrently or sequentially.  The downside of sequential work up is that this method may miss issues if more than one compartment is contributing.  She is aware that tubal factor or moderate to severe female factor infertility will require further consultation with a reproductive endocrinologist.  In the case of anovulation, use of Clomid (clomiphen citrate) or Femara (letrazole) were discussed with the understanding the the later is an off-label, but well supported use.  With either of these drugs the risk of multiples increases from the standard population rate of 2% to approximately 10%, with higher order multiples possible but unlikely.  Both drugs may require some time to titrate to the appropriate dosage to ensure consistent ovulation.  Cycles will be limited to 6 cycles on each drug secondary to decreasing rates of conception after 6 cycles.  In addition should patient be started on ovulation induction with Clomid she was advised to discontinue the drug for any vision changes as this is a rare but potentially permanent side-effect if medication is continued.  We discussed timing of intercourse as well as the use of ovulation predictor kits identify the patient's fertile window each month.    - at present monitored cycle with OPK and if no  conception in next 6 month consider proceeding with semen analysis, if normal HSG  2) Preconception counseling - immunization up to date.  The patient denies any family history of conditions which would warrant preconception genetic counseling or testing on her or her partner.  Instructed to start prenatal vitamins while trying to conceive.    3) A total of 15 minutes were spent in face-to-face contact with the patient during this encounter with over half of that time devoted to counseling and coordination of care.  4) Return in about 6 months (around 06/13/2021) for annual.    06/15/2021, MD, [facility], [facility] 12/12/2020, 4:21 PM

## 2020-12-27 ENCOUNTER — Telehealth: Payer: Self-pay

## 2020-12-27 ENCOUNTER — Telehealth: Payer: BC Managed Care – PPO | Admitting: Nurse Practitioner

## 2020-12-27 ENCOUNTER — Ambulatory Visit: Payer: BC Managed Care – PPO | Admitting: Obstetrics and Gynecology

## 2020-12-27 DIAGNOSIS — B3731 Acute candidiasis of vulva and vagina: Secondary | ICD-10-CM

## 2020-12-27 DIAGNOSIS — B373 Candidiasis of vulva and vagina: Secondary | ICD-10-CM | POA: Diagnosis not present

## 2020-12-27 NOTE — Telephone Encounter (Signed)
Pt calling to see what she can take for UTI until appt on Friday.  [phone]

## 2020-12-27 NOTE — Telephone Encounter (Signed)
Called pt, no answer, LVMTRC. 

## 2020-12-28 MED ORDER — FLUCONAZOLE 150 MG PO TABS: 150.0000 mg | ORAL_TABLET | Freq: Once | ORAL | 0 refills | Status: AC

## 2020-12-28 NOTE — Progress Notes (Signed)

## 2020-12-30 ENCOUNTER — Ambulatory Visit: Payer: BC Managed Care – PPO | Admitting: Obstetrics and Gynecology

## 2021-01-29 ENCOUNTER — Telehealth: Payer: BC Managed Care – PPO | Admitting: Physician Assistant

## 2021-01-29 DIAGNOSIS — R3989 Other symptoms and signs involving the genitourinary system: Secondary | ICD-10-CM

## 2021-01-29 DIAGNOSIS — Z3200 Encounter for pregnancy test, result unknown: Secondary | ICD-10-CM

## 2021-01-30 ENCOUNTER — Ambulatory Visit: Payer: BC Managed Care – PPO | Admitting: Obstetrics and Gynecology

## 2021-01-30 ENCOUNTER — Other Ambulatory Visit: Payer: Self-pay

## 2021-01-30 ENCOUNTER — Encounter: Payer: Self-pay | Admitting: Obstetrics and Gynecology

## 2021-01-30 VITALS — BP 110/80 | Ht 61.0 in | Wt 222.0 lb

## 2021-01-30 DIAGNOSIS — B373 Candidiasis of vulva and vagina: Secondary | ICD-10-CM

## 2021-01-30 DIAGNOSIS — N926 Irregular menstruation, unspecified: Secondary | ICD-10-CM | POA: Diagnosis not present

## 2021-01-30 DIAGNOSIS — B3731 Acute candidiasis of vulva and vagina: Secondary | ICD-10-CM

## 2021-01-30 DIAGNOSIS — R399 Unspecified symptoms and signs involving the genitourinary system: Secondary | ICD-10-CM | POA: Diagnosis not present

## 2021-01-30 LAB — POCT URINALYSIS DIPSTICK
Bilirubin, UA: NEGATIVE
Glucose, UA: NEGATIVE
Ketones, UA: NEGATIVE
Nitrite, UA: NEGATIVE
Protein, UA: NEGATIVE
Spec Grav, UA: 1.01 (ref 1.010–1.025)
pH, UA: 6 (ref 5.0–8.0)

## 2021-01-30 LAB — POCT WET PREP WITH KOH
Clue Cells Wet Prep HPF POC: NEGATIVE
KOH Prep POC: NEGATIVE
Trichomonas, UA: NEGATIVE
Yeast Wet Prep HPF POC: POSITIVE

## 2021-01-30 LAB — POCT URINE PREGNANCY: Preg Test, Ur: NEGATIVE

## 2021-01-30 MED ORDER — FLUCONAZOLE 150 MG PO TABS: 150.0000 mg | ORAL_TABLET | Freq: Once | ORAL | 0 refills | Status: AC

## 2021-01-30 NOTE — Progress Notes (Signed)
Flinchum, Nancy Fried, FNP   Chief Complaint  Patient presents with   Urinary Tract Infection    Burning urinating, lower back pain since last Friday   Vaginal Discharge    Cottage cheese, itchiness, irritation, fishy/sour odor since yesterday   Amenorrhea    HPI:      [name] is a 30 y.o. G1P1001 whose LMP was Patient's last menstrual period was 12/29/2020 (exact date)., presents today for UTI symptoms of dysuria with normal frequency, no urgency, normal flow for 4 days. Having LBP, no pelvic pain/fevers. Also with vaginal d/c with irritation/irritation for 4 days, with odor. Feels tender on LT labia. Recently changed from unscented soap to Oil of Olay. Was on abx for UTI sx 6/22 but had neg C&S at [facility]; hx of kidney stones. Pt is a few days late for her period. Menses are monthly and come on the 30th each month. Pt trying to conceive, no UPT done. Has been under increased stress due to upcoming travel. Had bloating/cramping a few days ago without bleeding.   Past Medical History:  Diagnosis Date   Calculus of kidney    Chlamydia    Polycystic ovaries     Past Surgical History:  Procedure Laterality Date   CESAREAN SECTION     CHOLECYSTECTOMY, LAPAROSCOPIC     WISDOM TOOTH EXTRACTION      Family History  Problem Relation Age of Onset   Hypertension Mother    Hypertension Father    Breast cancer Maternal Aunt        not sure of age    Social History   Socioeconomic History   Marital status: Single    Spouse name: Not on file   Number of children: Not on file   Years of education: Not on file   Highest education level: Not on file  Occupational History   Not on file  Tobacco Use   Smoking status: Never   Smokeless tobacco: Never  Vaping Use   Vaping Use: Never used  Substance and Sexual Activity   Alcohol use: Yes    Alcohol/week: 1.0 standard drink    Types: 1 Glasses of wine per week   Drug use: Never   Sexual activity: Yes    Partners:  Male    Birth control/protection: None  Other Topics Concern   Not on file  Social History Narrative   Not on file   Social Determinants of Health   Financial Resource Strain: Not on file  Food Insecurity: Not on file  Transportation Needs: Not on file  Physical Activity: Not on file  Stress: Not on file  Social Connections: Not on file  Intimate Partner Violence: Not on file    Outpatient Medications Prior to Visit  Medication Sig Dispense Refill   buPROPion (WELLBUTRIN XL) 300 MG 24 hr tablet Take 1 tablet (300 mg total) by mouth daily. (Patient not taking: Reported on 12/12/2020) 90 tablet 0   norethindrone-ethinyl estradiol (JUNEL FE 1/20) 1-20 MG-MCG tablet Take 1 tablet by mouth daily. (Patient not taking: Reported on 12/12/2020) 28 tablet 11   No facility-administered medications prior to visit.      ROS:  Review of Systems  Constitutional:  Negative for fever.  Gastrointestinal:  Negative for blood in stool, constipation, diarrhea, nausea and vomiting.  Genitourinary:  Positive for dysuria and vaginal discharge. Negative for dyspareunia, flank pain, frequency, hematuria, urgency, vaginal bleeding and vaginal pain.  Musculoskeletal:  Positive for back pain.  Skin:  Negative for rash.  BREAST: No symptoms   OBJECTIVE:   Vitals:  BP 110/80   Ht 5\' 1"  (1.549 m)   Wt 222 lb (100.7 kg)   LMP 12/29/2020 (Exact Date)   BMI 41.95 kg/m   Physical Exam Vitals reviewed.  Constitutional:      Appearance: She is well-developed. She is not ill-appearing or toxic-appearing.  Pulmonary:     Effort: Pulmonary effort is normal.  Abdominal:     Tenderness: There is no right CVA tenderness or left CVA tenderness.  Genitourinary:    General: Normal vulva.     Pubic Area: No rash.      Labia:        Right: No rash, tenderness or lesion.        Left: No rash, tenderness or lesion.      Vagina: Normal. No vaginal discharge, erythema or tenderness.     Cervix: Normal.      Uterus: Normal. Not enlarged and not tender.      Adnexa: Right adnexa normal and left adnexa normal.       Right: No mass or tenderness.         Left: No mass or tenderness.    Musculoskeletal:        General: Normal range of motion.       Arms:     Cervical back: Normal range of motion.  Skin:    General: Skin is warm and dry.  Neurological:     General: No focal deficit present.     Mental Status: She is alert and oriented to person, place, and time.     Cranial Nerves: No cranial nerve deficit.  Psychiatric:        Mood and Affect: Mood normal.        Behavior: Behavior normal.        Thought Content: Thought content normal.        Judgment: Judgment normal.    Results: Results for orders placed or performed in visit on 01/30/21 (from the past 24 hour(s))  POCT Urinalysis Dipstick     Status: Abnormal   Collection Time: 01/30/21  3:29 PM  Result Value Ref Range   Color, UA yellow    Clarity, UA clear    Glucose, UA Negative Negative   Bilirubin, UA neg    Ketones, UA neg    Spec Grav, UA 1.010 1.010 - 1.025   Blood, UA trace    pH, UA 6.0 5.0 - 8.0   Protein, UA Negative Negative   Urobilinogen, UA     Nitrite, UA neg    Leukocytes, UA Small (1+) (A) Negative   Appearance     Odor    POCT urine pregnancy     Status: Normal   Collection Time: 01/30/21  3:29 PM  Result Value Ref Range   Preg Test, Ur Negative Negative  POCT Wet Prep with KOH     Status: Abnormal   Collection Time: 01/30/21  3:30 PM  Result Value Ref Range   Trichomonas, UA Negative    Clue Cells Wet Prep HPF POC neg    Epithelial Wet Prep HPF POC     Yeast Wet Prep HPF POC pos    Bacteria Wet Prep HPF POC     RBC Wet Prep HPF POC     WBC Wet Prep HPF POC     KOH Prep POC Negative Negative     Assessment/Plan: Candidal vaginitis - Plan:  fluconazole (DIFLUCAN) 150 MG tablet, POCT Wet Prep with KOH; pos sx and wet prep/exam. Rx diflucan. OTC hydrocortisone crm ext prn. Change back to dove  sensitive skin soap. F/u prn.   UTI symptoms - Plan: POCT Urinalysis Dipstick, Urine Culture, sx more consistent with dysuria from vaginitis. Questionable UA, neg C&S 6/22. Recheck C&S. Will f/u if pos.   Late menses - Plan: POCT urine pregnancy; neg UPT. Recheck first AM UPT in a few days prn. F/u prn.    Meds ordered this encounter  Medications   fluconazole (DIFLUCAN) 150 MG tablet    Sig: Take 1 tablet (150 mg total) by mouth once for 1 dose. May repeat in 3 days if still having symptoms    Dispense:  2 tablet    Refill:  0    Order Specific Question:   Supervising Provider    Answer:   Nadara Mustard [[id number]]       Return if symptoms worsen or fail to improve.  Cariann Kinnamon B. Dabney Dever, PA-C 01/30/2021 3:32 PM

## 2021-01-30 NOTE — Progress Notes (Signed)
For the safety of you and your child, I recommend a face to face office visit with a health care provider.  Many mothers need to take medicines during their pregnancy and while nursing.  Almost all medicines pass into the breast milk in small quantities.  Most are generally considered safe for a mother to take but some medicines must be avoided.  After reviewing your E-Visit request, I recommend that you consult your OB/GYN or pediatrician for medical advice in relation to your condition and prescription medications while pregnant or breastfeeding.  NOTE:  There will be NO CHARGE for this eVisit  If you are having a true medical emergency please call 911.    For an urgent face to face visit, [facility] has six urgent care centers for your convenience:     [facility] [place] Get Driving Directions [phone] [address] [place], [place] [address]    [facility] ([place]) Get Driving Directions [phone] [address] [place], [place] [address]  [facility] ([place] [facility] [place]) Get Driving Directions [phone] [address] [place],  [place]  [address]  [facility] Get Driving Directions [phone] [address] [place], [place] [address]   [facility] [place] Get Driving Directions  [phone] [address] [place], [place] [address]    [facility] [place] Get Driving Directions [phone] [address] [place], [place] [address]  Your MyChart E-visit questionnaire answers were reviewed by a board certified advanced clinical practitioner to complete your personal care plan based on your specific symptoms.  Thank you for using e-Visits.   I provided 5 minutes of non face-to-face time during this encounter for chart review and documentation.   

## 2021-02-01 LAB — URINE CULTURE

## 2021-03-08 ENCOUNTER — Encounter: Payer: Self-pay | Admitting: Family Medicine

## 2021-03-08 ENCOUNTER — Telehealth: Payer: BC Managed Care – PPO | Admitting: Family Medicine

## 2021-03-08 DIAGNOSIS — F409 Phobic anxiety disorder, unspecified: Secondary | ICD-10-CM

## 2021-03-08 DIAGNOSIS — G43819 Other migraine, intractable, without status migrainosus: Secondary | ICD-10-CM

## 2021-03-08 DIAGNOSIS — R110 Nausea: Secondary | ICD-10-CM | POA: Diagnosis not present

## 2021-03-08 MED ORDER — ONDANSETRON HCL 4 MG PO TABS: 4.0000 mg | ORAL_TABLET | Freq: Three times a day (TID) | ORAL | 0 refills | Status: DC | PRN

## 2021-03-08 MED ORDER — RIZATRIPTAN BENZOATE 5 MG PO TABS
5.0000 mg | ORAL_TABLET | ORAL | 0 refills | Status: DC | PRN
Start: 2021-03-08 — End: 2021-08-06

## 2021-03-08 NOTE — Patient Instructions (Addendum)
I appreciate the opportunity to provide you with care for your health and wellness.  At all times do what you need to be safe. Consider having family or friends stay the evenings with you.  And remain in contact with your local police so that they are aware of situation.  Take medication as directed, call PCP if not better as this might be BP or stress driven and those migraines will continue without treatment of those.  Please continue to practice social distancing to keep you, your family, and our community safe.  If you must go out, please wear a mask and practice good handwashing.  Have a wonderful day. With Gratitude, Tereasa Coop, DNP, AGNP-BC  Rizatriptan Tablets What is this medication? RIZATRIPTAN (rye za TRIP tan) treats migraines. It works by blocking pain signals and narrowing blood vessels in the brain. It belongs to a group of medications called triptans. It is not used to prevent migraines. This medicine may be used for other purposes; ask your health care provider or pharmacist if you have questions. COMMON BRAND NAME(S): Maxalt What should I tell my care team before I take this medication? They need to know if you have any of these conditions: Cigarette smoker Circulation problems in fingers and toes Diabetes Heart disease High blood pressure High cholesterol History of irregular heartbeat History of stroke Kidney disease Liver disease Stomach or intestine problems An unusual or allergic reaction to rizatriptan, other medications, foods, dyes, or preservatives Pregnant or trying to get pregnant Breast-feeding How should I use this medication? Take this medication by mouth with a glass of water. Follow the directions on the prescription label. Do not take it more often than directed. Talk to your care team regarding the use of this medication in children. While this medication may be prescribed for children as young as 6 years for selected conditions,  precautions do apply. Overdosage: If you think you have taken too much of this medicine contact a poison control center or emergency room at once. NOTE: This medicine is only for you. Do not share this medicine with others. What if I miss a dose? This does not apply. This medication is not for regular use. What may interact with this medication? Do not take this medication with any of the following: Certain medications for migraine headache like almotriptan, eletriptan, frovatriptan, naratriptan, rizatriptan, sumatriptan, zolmitriptan Ergot alkaloids like dihydroergotamine, ergonovine, ergotamine, methylergonovine MAOIs like Carbex, Eldepryl, Marplan, Nardil, and Parnate This medication may also interact with the following: Certain medications for depression, anxiety, or psychotic disorders Propranolol This list may not describe all possible interactions. Give your health care provider a list of all the medicines, herbs, non-prescription drugs, or dietary supplements you use. Also tell them if you smoke, drink alcohol, or use illegal drugs. Some items may interact with your medicine. What should I watch for while using this medication? Visit your care team for regular checks on your progress. Tell your care team if your symptoms do not start to get better or if they get worse. You may get drowsy or dizzy. Do not drive, use machinery, or do anything that needs mental alertness until you know how this medication affects you. Do not stand up or sit up quickly, especially if you are an older patient. This reduces the risk of dizzy or fainting spells. Alcohol may interfere with the effect of this medication. Your mouth may get dry. Chewing sugarless gum or sucking hard candy and drinking plenty of water may help. Contact your  care team if the problem does not go away or is severe. If you take migraine medications for 10 or more days a month, your migraines may get worse. Keep a diary of headache days  and medication use. Contact your care team if your migraine attacks occur more frequently. What side effects may I notice from receiving this medication? Side effects that you should report to your care team as soon as possible: Allergic reactions-skin rash, itching, hives, swelling of the face, lips, tongue, or throat Burning, pain, tingling, or color changes in the legs or feet Heart attack-pain or tightness in the chest, shoulders, arms, or jaw, nausea, shortness of breath, cold or clammy skin, feeling faint or lightheaded Heart rhythm changes-fast or irregular heartbeat, dizziness, feeling faint or lightheaded, chest pain, trouble breathing Increase in blood pressure Irritability, confusion, fast or irregular heartbeat, muscle stiffness, twitching muscles, sweating, high fever, seizure, chills, vomiting, diarrhea, which may be signs of serotonin syndrome Raynaud's-cool, numb, or painful fingers or toes that may change color from pale, to blue, to red Seizures Stroke-sudden numbness or weakness of the face, arm, or leg, trouble speaking, confusion, trouble walking, loss of balance or coordination, dizziness, severe headache, change in vision Sudden or severe stomach pain, nausea, vomiting, fever, or bloody diarrhea Vision loss Side effects that usually do not require medical attention (report to your care team if they continue or are bothersome): Dizziness General discomfort or fatigue This list may not describe all possible side effects. Call your doctor for medical advice about side effects. You may report side effects to FDA at 1-800-FDA-1088. Where should I keep my medication? Keep out of the reach of children and pets. Store at room temperature between 15 and 30 degrees C (59 and 86 degrees F). Keep container tightly closed. Throw away any unused medication after the expiration date. NOTE: This sheet is a summary. It may not cover all possible information. If you have questions about  this medicine, talk to your doctor, pharmacist, or health care provider.  2022 Elsevier/Gold Standard (2020-07-14 12:30:25)  Ondansetron Tablets What is this medication? ONDANSETRON (on DAN se tron) prevents nausea and vomiting from chemotherapy, radiation, or surgery. It works by blocking substances in the body that may cause nausea or vomiting. It belongs to a group of medications called antiemetics. This medicine may be used for other purposes; ask your health care provider or pharmacist if you have questions. COMMON BRAND NAME(S): Zofran What should I tell my care team before I take this medication? They need to know if you have any of these conditions: Heart disease History of irregular heartbeat Liver disease Low levels of magnesium or potassium in the blood An unusual or allergic reaction to ondansetron, granisetron, other medications, foods, dyes, or preservatives Pregnant or trying to get pregnant Breast-feeding How should I use this medication? Take this medication by mouth with a glass of water. Follow the directions on your prescription label. Take your doses at regular intervals. Do not take your medication more often than directed. Talk to your care team regarding the use of this medication in children. Special care may be needed. Overdosage: If you think you have taken too much of this medicine contact a poison control center or emergency room at once. NOTE: This medicine is only for you. Do not share this medicine with others. What if I miss a dose? If you miss a dose, take it as soon as you can. If it is almost time for your next dose, take  only that dose. Do not take double or extra doses. What may interact with this medication? Do not take this medication with any of the following: Apomorphine Certain medications for fungal infections like fluconazole, itraconazole, ketoconazole, posaconazole, voriconazole Cisapride Dronedarone Pimozide Thioridazine This medication  may also interact with the following: Carbamazepine Certain medications for depression, anxiety, or psychotic disturbances Fentanyl Linezolid MAOIs like Carbex, Eldepryl, Marplan, Nardil, and Parnate Methylene blue (injected into a vein) Other medications that prolong the QT interval (cause an abnormal heart rhythm) like dofetilide, ziprasidone Phenytoin Rifampicin Tramadol This list may not describe all possible interactions. Give your health care provider a list of all the medicines, herbs, non-prescription drugs, or dietary supplements you use. Also tell them if you smoke, drink alcohol, or use illegal drugs. Some items may interact with your medicine. What should I watch for while using this medication? Check with your care team right away if you have any sign of an allergic reaction. What side effects may I notice from receiving this medication? Side effects that you should report to your care team as soon as possible: Allergic reactions-skin rash, itching, hives, swelling of the face, lips, tongue, or throat Bowel blockage-stomach cramping, unable to have a bowel movement or pass gas, loss of appetite, vomiting Chest pain (angina)-pain, pressure, or tightness in the chest, neck, back, or arms Heart rhythm changes-fast or irregular heartbeat, dizziness, feeling faint or lightheaded, chest pain, trouble breathing Irritability, confusion, fast or irregular heartbeat, muscle stiffness, twitching muscles, sweating, high fever, seizure, chills, vomiting, diarrhea, which may be signs of serotonin syndrome Side effects that usually do not require medical attention (report to your care team if they continue or are bothersome): Constipation Diarrhea General discomfort and fatigue Headache This list may not describe all possible side effects. Call your doctor for medical advice about side effects. You may report side effects to FDA at 1-800-FDA-1088. Where should I keep my medication? Keep out  of the reach of children and pets. Store between 2 and 30 degrees C (36 and 86 degrees F). Throw away any unused medication after the expiration date. NOTE: This sheet is a summary. It may not cover all possible information. If you have questions about this medicine, talk to your doctor, pharmacist, or health care provider.  2022 Elsevier/Gold Standard (2020-08-30 14:24:46)

## 2021-03-08 NOTE — Progress Notes (Signed)
Nancy Ramos, Nancy Ramos are scheduled for a virtual visit with your provider today.    Just as we do with appointments in the office, we must obtain your consent to participate.  Your consent will be active for this visit and any virtual visit you may have with one of our providers in the next 365 days.    If you have a MyChart account, I can also send a copy of this consent to you electronically.  All virtual visits are billed to your insurance company just like a traditional visit in the office.  As this is a virtual visit, video technology does not allow for your provider to perform a traditional examination.  This may limit your provider's ability to fully assess your condition.  If your provider identifies any concerns that need to be evaluated in person or the need to arrange testing such as labs, EKG, etc, we will make arrangements to do so.    Although advances in technology are sophisticated, we cannot ensure that it will always work on either your end or our end.  If the connection with a video visit is poor, we may have to switch to a telephone visit.  With either a video or telephone visit, we are not always able to ensure that we have a secure connection.   I need to obtain your verbal consent now.   Are you willing to proceed with your visit today?   Nancy Ramos has provided verbal consent on 03/08/2021 for a virtual visit (video or telephone).   Freddy Finner, NP 03/08/2021  11:06 AM   Date:  03/08/2021   ID:  Nancy Ramos, DOB Jul 04, 1990, MRN [id number]  Patient Location: Home Provider Location: Home Office   Participants: Patient and Provider for Visit and Wrap up  Method of visit: Video  Location of Patient: Home Location of Provider: Home Office Consent was obtain for visit over the video. Services rendered by provider: Visit was performed via video  A video enabled telemedicine application was used and I verified that I am speaking with the correct person using two  identifiers.  PCP:  Berniece Pap, FNP   Chief Complaint:  migraine   History of Present Illness:    Nancy Ramos is a 30 y.o. female with history as stated below. Presents video telehealth for an acute care visit due to migraine- left side. Uses OTC medications: tylenol, and Advil and the migraine combo ones as well. Not near cycle- it just ended. She repots high stress and fear due to a stalker type situation. She is working on getting a restraining order. This headache she feels is a little different- but this is is is Related to the stress she is experiencing. She is not having double vision, but reports nausea.  Unsure of her BP readings.   Past Medical, Surgical, Social History, Allergies, and Medications have been Reviewed.  Past Medical History:  Diagnosis Date   Calculus of kidney    Chlamydia    Polycystic ovaries     No outpatient medications have been marked as taking for the 03/08/21 encounter (Appointment) with [facility] PROVIDER.     Allergies:   Patient has no known allergies.   ROS See HPI for history of present illness.  Physical Exam Constitutional:      Appearance: Normal appearance.  HENT:     Head: Normocephalic and atraumatic.     Right Ear: External ear normal.     Left Ear: External ear  normal.     Nose: Nose normal.  Eyes:     Conjunctiva/sclera: Conjunctivae normal.  Pulmonary:     Effort: Pulmonary effort is normal.  Musculoskeletal:        General: Normal range of motion.     Cervical back: Normal range of motion.  Neurological:     Mental Status: She is alert and oriented to person, place, and time.  Psychiatric:        Mood and Affect: Mood normal.        Behavior: Behavior normal.        Thought Content: Thought content normal.        Judgment: Judgment normal.              A&P 1. Other migraine without status migrainosus, intractable Abortive medication with maxalt provided and advised close follow up if no  improvement Might be BP or stress driven OTC measures reviewed with her .   - rizatriptan (MAXALT) 5 MG tablet; Take 1 tablet (5 mg total) by mouth as needed for migraine. May repeat in 2 hours if needed  Dispense: 10 tablet; Refill: 0 - ondansetron (ZOFRAN) 4 MG tablet; Take 1 tablet (4 mg total) by mouth every 8 (eight) hours as needed for nausea or vomiting.  Dispense: 15 tablet; Refill: 0  2. Nausea Migraine induced- provided with zofran as needed Additionally discussed role of reflux related to stress and OTC measures for this  - ondansetron (ZOFRAN) 4 MG tablet; Take 1 tablet (4 mg total) by mouth every 8 (eight) hours as needed for nausea or vomiting.  Dispense: 15 tablet; Refill: 0   3. Fear for personal safety She is advised to reach out to the police for the situation- reports she has done this. She is encouraged to have family or friends stay with her especially over night when she feels the least safe.    Time:   Today, I have spent 10 minutes with the patient with telehealth technology discussing the above problems, reviewing the chart, previous notes, medications and orders.   Medication Changes: No orders of the defined types were placed in this encounter.    Disposition:  Follow up PCP Signed, Freddy Finner, NP  03/08/2021 11:06 AM

## 2021-04-04 ENCOUNTER — Telehealth: Payer: BC Managed Care – PPO | Admitting: Physician Assistant

## 2021-04-04 DIAGNOSIS — B9689 Other specified bacterial agents as the cause of diseases classified elsewhere: Secondary | ICD-10-CM | POA: Diagnosis not present

## 2021-04-04 DIAGNOSIS — J019 Acute sinusitis, unspecified: Secondary | ICD-10-CM | POA: Diagnosis not present

## 2021-04-04 MED ORDER — AMOXICILLIN-POT CLAVULANATE 875-125 MG PO TABS: 1.0000 | ORAL_TABLET | Freq: Two times a day (BID) | ORAL | 0 refills | Status: DC

## 2021-04-04 NOTE — Patient Instructions (Signed)
[name], thank you for joining [name], PA-C for today's virtual visit.  While this provider is not your primary care provider (PCP), if your PCP is located in our provider database this encounter information will be shared with them immediately following your visit.  Consent: (Patient) Nancy Ramos provided verbal consent for this virtual visit at the beginning of the encounter.  Current Medications:  Current Outpatient Medications:    amoxicillin-clavulanate (AUGMENTIN) 875-125 MG tablet, Take 1 tablet by mouth 2 (two) times daily., Disp: 20 tablet, Rfl: 0   ondansetron (ZOFRAN) 4 MG tablet, Take 1 tablet (4 mg total) by mouth every 8 (eight) hours as needed for nausea or vomiting., Disp: 15 tablet, Rfl: 0   rizatriptan (MAXALT) 5 MG tablet, Take 1 tablet (5 mg total) by mouth as needed for migraine. May repeat in 2 hours if needed, Disp: 10 tablet, Rfl: 0   Medications ordered in this encounter:  Meds ordered this encounter  Medications   amoxicillin-clavulanate (AUGMENTIN) 875-125 MG tablet    Sig: Take 1 tablet by mouth 2 (two) times daily.    Dispense:  20 tablet    Refill:  0    Order Specific Question:   Supervising Provider    Answer:   Hyacinth Meeker, BRIAN [[id number]]     *If you need refills on other medications prior to your next appointment, please contact your pharmacy*  Follow-Up: Call back or seek an in-person evaluation if the symptoms worsen or if the condition fails to improve as anticipated.  Other Instructions Sinusitis, Adult Sinusitis is soreness and swelling (inflammation) of your sinuses. Sinuses are hollow spaces in the bones around your face. They are located: Around your eyes. In the middle of your forehead. Behind your nose. In your cheekbones. Your sinuses and nasal passages are lined with a fluid called mucus. Mucus drains out of your sinuses. Swelling can trap mucus in your sinuses. This lets germs (bacteria, virus, or fungus)  grow, which leads to infection. Most of the time, this condition is caused by a virus. What are the causes? This condition is caused by: Allergies. Asthma. Germs. Things that block your nose or sinuses. Growths in the nose (nasal polyps). Chemicals or irritants in the air. Fungus (rare). What increases the risk? You are more likely to develop this condition if: You have a weak body defense system (immune system). You do a lot of swimming or diving. You use nasal sprays too much. You smoke. What are the signs or symptoms? The main symptoms of this condition are pain and a feeling of pressure around the sinuses. Other symptoms include: Stuffy nose (congestion). Runny nose (drainage). Swelling and warmth in the sinuses. Headache. Toothache. A cough that may get worse at night. Mucus that collects in the throat or the back of the nose (postnasal drip). Being unable to smell and taste. Being very tired (fatigue). A fever. Sore throat. Bad breath. How is this diagnosed? This condition is diagnosed based on: Your symptoms. Your medical history. A physical exam. Tests to find out if your condition is short-term (acute) or long-term (chronic). Your doctor may: Check your nose for growths (polyps). Check your sinuses using a tool that has a light (endoscope). Check for allergies or germs. Do imaging tests, such as an MRI or CT scan. How is this treated? Treatment for this condition depends on the cause and whether it is short-term or long-term. If caused by a virus, your symptoms should go away on their  own within 10 days. You may be given medicines to relieve symptoms. They include: Medicines that shrink swollen tissue in the nose. Medicines that treat allergies (antihistamines). A spray that treats swelling of the nostrils.  Rinses that help get rid of thick mucus in your nose (nasal saline washes). If caused by bacteria, your doctor may wait to see if you will get better  without treatment. You may be given antibiotic medicine if you have: A very bad infection. A weak body defense system. If caused by growths in the nose, you may need to have surgery. Follow these instructions at home: Medicines Take, use, or apply over-the-counter and prescription medicines only as told by your doctor. These may include nasal sprays. If you were prescribed an antibiotic medicine, take it as told by your doctor. Do not stop taking the antibiotic even if you start to feel better. Hydrate and humidify  Drink enough water to keep your pee (urine) pale yellow. Use a cool mist humidifier to keep the humidity level in your home above 50%. Breathe in steam for 10-15 minutes, 3-4 times a day, or as told by your doctor. You can do this in the bathroom while a hot shower is running. Try not to spend time in cool or dry air. Rest Rest as much as you can. Sleep with your head raised (elevated). Make sure you get enough sleep each night. General instructions  Put a warm, moist washcloth on your face 3-4 times a day, or as often as told by your doctor. This will help with discomfort. Wash your hands often with soap and water. If there is no soap and water, use hand sanitizer. Do not smoke. Avoid being around people who are smoking (secondhand smoke). Keep all follow-up visits as told by your doctor. This is important. Contact a doctor if: You have a fever. Your symptoms get worse. Your symptoms do not get better within 10 days. Get help right away if: You have a very bad headache. You cannot stop throwing up (vomiting). You have very bad pain or swelling around your face or eyes. You have trouble seeing. You feel confused. Your neck is stiff. You have trouble breathing. Summary Sinusitis is swelling of your sinuses. Sinuses are hollow spaces in the bones around your face. This condition is caused by tissues in your nose that become inflamed or swollen. This traps germs. These  can lead to infection. If you were prescribed an antibiotic medicine, take it as told by your doctor. Do not stop taking it even if you start to feel better. Keep all follow-up visits as told by your doctor. This is important. This information is not intended to replace advice given to you by your health care provider. Make sure you discuss any questions you have with your health care provider. Document Revised: 11/18/2017 Document Reviewed: 11/18/2017 Elsevier Patient Education  2022 [employer].    If you have been instructed to have an in-person evaluation today at a local Urgent Care facility, please use the link below. It will take you to a list of all of our available [facility], including address, phone number and hours of operation. Please do not delay care.  [facility]  If you or a family member do not have a primary care provider, use the link below to schedule a visit and establish care. When you choose a [facility] primary care physician or advanced practice provider, you gain a long-term partner in health. Find  a Primary Care Provider  Learn more about [facility]'s in-office and virtual care options: [facility] - Get Care Now

## 2021-04-04 NOTE — Progress Notes (Signed)
Virtual Visit Consent   SARAHGRACE Ramos, you are scheduled for a virtual visit with a [facility] provider today.     Just as with appointments in the office, your consent must be obtained to participate.  Your consent will be active for this visit and any virtual visit you may have with one of our providers in the next 365 days.     If you have a MyChart account, a copy of this consent can be sent to you electronically.  All virtual visits are billed to your insurance company just like a traditional visit in the office.    As this is a virtual visit, video technology does not allow for your provider to perform a traditional examination.  This may limit your provider's ability to fully assess your condition.  If your provider identifies any concerns that need to be evaluated in person or the need to arrange testing (such as labs, EKG, etc.), we will make arrangements to do so.     Although advances in technology are sophisticated, we cannot ensure that it will always work on either your end or our end.  If the connection with a video visit is poor, the visit may have to be switched to a telephone visit.  With either a video or telephone visit, we are not always able to ensure that we have a secure connection.     I need to obtain your verbal consent now.   Are you willing to proceed with your visit today?    BRINNA DIVELBISS has provided verbal consent on 04/04/2021 for a virtual visit (video or telephone).   [name], PA-C   Date: 04/04/2021 3:26 PM   Virtual Visit via Video Note   I, [name], connected with  KENNEDE Ramos  ([id number], 1991/02/05) on 04/04/21 at  3:15 PM EDT by a video-enabled telemedicine application and verified that I am speaking with the correct person using two identifiers.  Location: Patient: Virtual Visit Location Patient: Mobile Provider: Virtual Visit Location Provider: Home Office   I discussed the limitations of evaluation and  management by telemedicine and the availability of in person appointments. The patient expressed understanding and agreed to proceed.    History of Present Illness: Nancy Ramos is a 30 y.o. who identifies as a female who was assigned female at birth, and is being seen today for possible sinus infection.  HPI: Sinusitis This is a new problem. The current episode started 1 to 4 weeks ago. The problem has been gradually worsening since onset. There has been no fever. The pain is moderate. Associated symptoms include chills, congestion, coughing (productive), ear pain (right ear worse), headaches, sinus pressure, a sore throat and swollen glands. Pertinent negatives include no shortness of breath or sneezing. Treatments tried: nyquil, dayquil, vicks vapor rub. The treatment provided mild relief.     Problems:  Patient Active Problem List   Diagnosis Date Noted   Other fatigue 06/07/2020   Vitamin D deficiency 06/07/2020   IUD (intrauterine device) in place 06/07/2020    Allergies: No Known Allergies Medications:  Current Outpatient Medications:    amoxicillin-clavulanate (AUGMENTIN) 875-125 MG tablet, Take 1 tablet by mouth 2 (two) times daily., Disp: 20 tablet, Rfl: 0   ondansetron (ZOFRAN) 4 MG tablet, Take 1 tablet (4 mg total) by mouth every 8 (eight) hours as needed for nausea or vomiting., Disp: 15 tablet, Rfl: 0   rizatriptan (MAXALT) 5 MG tablet, Take 1 tablet (5  mg total) by mouth as needed for migraine. May repeat in 2 hours if needed, Disp: 10 tablet, Rfl: 0  Observations/Objective: Patient is well-developed, well-nourished in no acute distress.  Resting comfortably at home.  Head is normocephalic, atraumatic.  No labored breathing.  Speech is clear and coherent with logical content.  Patient is alert and oriented at baseline.    Assessment and Plan: 1. Acute bacterial sinusitis - amoxicillin-clavulanate (AUGMENTIN) 875-125 MG tablet; Take 1 tablet by mouth 2 (two)  times daily.  Dispense: 20 tablet; Refill: 0  - Worsening symptoms that have not responded to OTC medications.  - Will give augmentin as below.  - Continue allergy medications.  - Stay well hydrated and get plenty of rest.  - Call or seek in person evaluation if no symptom improvement or if symptoms worsen.  Follow Up Instructions: I discussed the assessment and treatment plan with the patient. The patient was provided an opportunity to ask questions and all were answered. The patient agreed with the plan and demonstrated an understanding of the instructions.  A copy of instructions were sent to the patient via MyChart unless otherwise noted below.   The patient was advised to call back or seek an in-person evaluation if the symptoms worsen or if the condition fails to improve as anticipated.  Time:  I spent 14 minutes with the patient via telehealth technology discussing the above problems/concerns.    [name], PA-C

## 2021-05-30 ENCOUNTER — Other Ambulatory Visit: Payer: Self-pay

## 2021-05-30 ENCOUNTER — Ambulatory Visit (INDEPENDENT_AMBULATORY_CARE_PROVIDER_SITE_OTHER): Payer: BC Managed Care – PPO | Admitting: Family Medicine

## 2021-05-30 ENCOUNTER — Encounter: Payer: Self-pay | Admitting: Family Medicine

## 2021-05-30 VITALS — BP 100/69 | HR 101 | Temp 98.0°F | Resp 16 | Ht 61.0 in | Wt 215.6 lb

## 2021-05-30 DIAGNOSIS — N926 Irregular menstruation, unspecified: Secondary | ICD-10-CM

## 2021-05-30 DIAGNOSIS — R112 Nausea with vomiting, unspecified: Secondary | ICD-10-CM | POA: Insufficient documentation

## 2021-05-30 DIAGNOSIS — J029 Acute pharyngitis, unspecified: Secondary | ICD-10-CM | POA: Insufficient documentation

## 2021-05-30 DIAGNOSIS — Z20828 Contact with and (suspected) exposure to other viral communicable diseases: Secondary | ICD-10-CM | POA: Insufficient documentation

## 2021-05-30 DIAGNOSIS — G43819 Other migraine, intractable, without status migrainosus: Secondary | ICD-10-CM

## 2021-05-30 DIAGNOSIS — R110 Nausea: Secondary | ICD-10-CM

## 2021-05-30 LAB — POCT URINE PREGNANCY: Preg Test, Ur: NEGATIVE

## 2021-05-30 MED ORDER — ONDANSETRON HCL 4 MG PO TABS: 4.0000 mg | ORAL_TABLET | Freq: Three times a day (TID) | ORAL | 0 refills | Status: DC | PRN

## 2021-05-30 MED ORDER — OSELTAMIVIR PHOSPHATE 75 MG PO CAPS: 75.0000 mg | ORAL_CAPSULE | Freq: Two times a day (BID) | ORAL | 0 refills | Status: AC

## 2021-05-30 NOTE — Progress Notes (Signed)
Established patient visit   Patient: Nancy Ramos   DOB: September 03, 1990   30 y.o. Female  MRN: [id number] Visit Date: 05/30/2021  Today's healthcare provider: Jacky Kindle, FNP   Chief Complaint  Patient presents with   URI   Subjective    URI  This is a new problem. The current episode started yesterday. The problem has been unchanged. Associated symptoms include nausea, a sore throat and vomiting. Pertinent negatives include no congestion, coughing, ear pain, headaches, rhinorrhea, sinus pain, sneezing or wheezing. Associated symptoms comments: Chills, body ache Lower back pain Cramping lower abdomen. She has tried decongestant (Zofran. Advil sinus congestion) for the symptoms. The treatment provided no relief.     Medications: Outpatient Medications Prior to Visit  Medication Sig   rizatriptan (MAXALT) 5 MG tablet Take 1 tablet (5 mg total) by mouth as needed for migraine. May repeat in 2 hours if needed   [DISCONTINUED] ondansetron (ZOFRAN) 4 MG tablet Take 1 tablet (4 mg total) by mouth every 8 (eight) hours as needed for nausea or vomiting.   [DISCONTINUED] amoxicillin-clavulanate (AUGMENTIN) 875-125 MG tablet Take 1 tablet by mouth 2 (two) times daily.   No facility-administered medications prior to visit.    Review of Systems  HENT:  Positive for sore throat. Negative for congestion, ear pain, rhinorrhea, sinus pain and sneezing.   Respiratory:  Negative for cough and wheezing.   Gastrointestinal:  Positive for nausea and vomiting.  Neurological:  Negative for headaches.      Objective    BP 100/69 (BP Location: Right Arm, Patient Position: Sitting, Cuff Size: Large)   Pulse (!) 101   Temp 98 F (36.7 C) (Oral)   Resp 16   Ht 5\' 1"  (1.549 m)   Wt 215 lb 9.6 oz (97.8 kg)   LMP 05/07/2021   SpO2 100%   BMI 40.74 kg/m    Physical Exam Vitals and nursing note reviewed.  Constitutional:      General: She is not in acute distress.    Appearance:  Normal appearance. She is obese. She is ill-appearing. She is not toxic-appearing or diaphoretic.  HENT:     Head: Normocephalic and atraumatic.     Mouth/Throat:     Tongue: No lesions.     Pharynx: Pharyngeal swelling present. No oropharyngeal exudate.     Tonsils: No tonsillar exudate or tonsillar abscesses.   Cardiovascular:     Rate and Rhythm: Regular rhythm. Tachycardia present.     Pulses: Normal pulses.     Heart sounds: Normal heart sounds. No murmur heard.   No friction rub. No gallop.     Comments: Tachycardia likely related to poor PO intake Pulmonary:     Effort: Pulmonary effort is normal. No respiratory distress.     Breath sounds: Normal breath sounds. No stridor. No wheezing, rhonchi or rales.  Chest:     Chest wall: No tenderness.  Abdominal:     General: Bowel sounds are normal. There is no distension.     Palpations: Abdomen is soft. There is no mass.     Tenderness: There is abdominal tenderness. There is guarding. There is no right CVA tenderness or left CVA tenderness.     Comments: Generalized guarding 'around pubic area'  Musculoskeletal:        General: No swelling, tenderness, deformity or signs of injury. Normal range of motion.     Right lower leg: No edema.     Left lower  leg: No edema.     Comments: Generalized lower back complaints; no focal pain  Skin:    General: Skin is warm and dry.     Capillary Refill: Capillary refill takes less than 2 seconds.     Coloration: Skin is not jaundiced or pale.     Findings: No bruising, erythema, lesion or rash.  Neurological:     General: No focal deficit present.     Mental Status: She is alert and oriented to person, place, and time. Mental status is at baseline.     Cranial Nerves: No cranial nerve deficit.     Sensory: No sensory deficit.     Motor: No weakness.     Coordination: Coordination normal.  Psychiatric:        Mood and Affect: Mood normal.        Behavior: Behavior normal.        Thought  Content: Thought content normal.        Judgment: Judgment normal.      Results for orders placed or performed in visit on 05/30/21  POCT urine pregnancy  Result Value Ref Range   Preg Test, Ur Negative Negative    Assessment & Plan     Problem List Items Addressed This Visit       Respiratory   Pharyngitis    Probably influenza given exposure; and voiced recovery s/p augmentin for sinusitis early last month Recommend continued use of OTC medications in addition to tamiflu      Relevant Medications   oseltamivir (TAMIFLU) 75 MG capsule     Digestive   Nausea and vomiting    Try to remain hydrated, goal of 4 16 oz bottles of water per day at minimum Try to add powerade/gatorade if needed  Recommend use of zofran to assist with associated nausea       Relevant Medications   oseltamivir (TAMIFLU) 75 MG capsule   ondansetron (ZOFRAN) 4 MG tablet   Other Relevant Orders   POCT urine pregnancy (Completed)     Other   Exposure to the flu - Primary    Start tamiflu today; continue every 12 hrs for 5 days      Relevant Medications   oseltamivir (TAMIFLU) 75 MG capsule   Menstruation, irregular    Given onset of nausea/vomiting and actively trying to conceive plus hx of polycystic and known irregular cycles POCT pregnancy negative       Other Visit Diagnoses     Other migraine without status migrainosus, intractable       Nausea            Return if symptoms worsen or fail to improve.      Leilani Merl, FNP, have reviewed all documentation for this visit. The documentation on 05/30/21 for the exam, diagnosis, procedures, and orders are all accurate and complete.    Jacky Kindle, FNP  [facility] [phone] (phone) [phone] (fax)  [facility]

## 2021-05-30 NOTE — Assessment & Plan Note (Signed)
Start tamiflu today; continue every 12 hrs for 5 days

## 2021-05-30 NOTE — Assessment & Plan Note (Signed)
Try to remain hydrated, goal of 4 16 oz bottles of water per day at minimum Try to add powerade/gatorade if needed  Recommend use of zofran to assist with associated nausea

## 2021-05-30 NOTE — Assessment & Plan Note (Signed)
Given onset of nausea/vomiting and actively trying to conceive plus hx of polycystic and known irregular cycles POCT pregnancy negative

## 2021-05-30 NOTE — Assessment & Plan Note (Signed)
Probably influenza given exposure; and voiced recovery s/p augmentin for sinusitis early last month Recommend continued use of OTC medications in addition to tamiflu

## 2021-06-13 ENCOUNTER — Ambulatory Visit: Payer: BC Managed Care – PPO | Admitting: Obstetrics and Gynecology

## 2021-06-14 ENCOUNTER — Other Ambulatory Visit: Payer: Self-pay

## 2021-06-14 ENCOUNTER — Ambulatory Visit (INDEPENDENT_AMBULATORY_CARE_PROVIDER_SITE_OTHER): Payer: BC Managed Care – PPO | Admitting: Obstetrics and Gynecology

## 2021-06-14 ENCOUNTER — Other Ambulatory Visit (HOSPITAL_COMMUNITY)
Admission: RE | Admit: 2021-06-14 | Discharge: 2021-06-14 | Disposition: A | Discharge status: Home / Self Care | Payer: BC Managed Care – PPO | Source: Ambulatory Visit | Attending: Obstetrics and Gynecology | Admitting: Obstetrics and Gynecology

## 2021-06-14 ENCOUNTER — Encounter: Payer: Self-pay | Admitting: Obstetrics and Gynecology

## 2021-06-14 VITALS — BP 112/68 | HR 90 | Ht 61.0 in | Wt 215.0 lb

## 2021-06-14 DIAGNOSIS — Z8742 Personal history of other diseases of the female genital tract: Secondary | ICD-10-CM

## 2021-06-14 DIAGNOSIS — Z3169 Encounter for other general counseling and advice on procreation: Secondary | ICD-10-CM | POA: Diagnosis not present

## 2021-06-14 DIAGNOSIS — Z124 Encounter for screening for malignant neoplasm of cervix: Secondary | ICD-10-CM | POA: Diagnosis present

## 2021-06-14 DIAGNOSIS — N979 Female infertility, unspecified: Secondary | ICD-10-CM

## 2021-06-14 DIAGNOSIS — Z0000 Encounter for general adult medical examination without abnormal findings: Secondary | ICD-10-CM

## 2021-06-14 DIAGNOSIS — Z01419 Encounter for gynecological examination (general) (routine) without abnormal findings: Secondary | ICD-10-CM | POA: Diagnosis not present

## 2021-06-14 NOTE — Progress Notes (Signed)
Gynecology Annual Exam   PCP: Jacky Kindle, FNP  Chief Complaint:  Chief Complaint  Patient presents with   Gynecologic Exam    Annual - discuss conception. RM 4    History of Present Illness: Patient is a 30 y.o. G1P1001 presents for annual exam. The patient has no complaints today.   LMP: Patient's last menstrual period was 06/05/2021. Regular monthly no menstrual complaints.   The patient is sexually active. She currently uses none for contraception. Her an and her partner have now been trying to conceive for 12 months since her Mirena IUd removal.  She denies dyspareunia.  The patient does perform self breast exams.  There is no notable family history of breast or ovarian cancer in her family.  The patient wears seatbelts: yes.   The patient has regular exercise: not asked.    The patient denies current symptoms of depression.    Review of Systems: ROS  Past Medical History:  Patient Active Problem List   Diagnosis Date Noted   Exposure to the flu 05/30/2021   Pharyngitis 05/30/2021   Nausea and vomiting 05/30/2021   Menstruation, irregular 05/30/2021   Other fatigue 06/07/2020   Vitamin D deficiency 06/07/2020   IUD (intrauterine device) in place 06/07/2020    Past Surgical History:  Past Surgical History:  Procedure Laterality Date   CESAREAN SECTION     CHOLECYSTECTOMY, LAPAROSCOPIC     WISDOM TOOTH EXTRACTION      Gynecologic History:  Patient's last menstrual period was 06/05/2021. Contraception: none Last Pap: Results were: 12/2721NIL and HR HPV+   Obstetric History: G1P1001  Family History:  Family History  Problem Relation Age of Onset   Hypertension Mother    Hypertension Father    Breast cancer Maternal Aunt        not sure of age    Social History:  Social History   Socioeconomic History   Marital status: Single    Spouse name: Not on file   Number of children: Not on file   Years of education: Not on file   Highest education  level: Not on file  Occupational History   Not on file  Tobacco Use   Smoking status: Never   Smokeless tobacco: Never  Vaping Use   Vaping Use: Never used  Substance and Sexual Activity   Alcohol use: Yes    Alcohol/week: 1.0 standard drink    Types: 1 Glasses of wine per week   Drug use: Never   Sexual activity: Yes    Partners: Male    Birth control/protection: None  Other Topics Concern   Not on file  Social History Narrative   Not on file   Social Determinants of Health   Financial Resource Strain: Not on file  Food Insecurity: Not on file  Transportation Needs: Not on file  Physical Activity: Not on file  Stress: Not on file  Social Connections: Not on file  Intimate Partner Violence: Not on file    Allergies:  No Known Allergies  Medications: Prior to Admission medications   Medication Sig Start Date End Date Taking? Authorizing Provider  ondansetron (ZOFRAN) 4 MG tablet Take 1 tablet (4 mg total) by mouth every 8 (eight) hours as needed for nausea or vomiting. 05/30/21   Jacky Kindle, FNP  rizatriptan (MAXALT) 5 MG tablet Take 1 tablet (5 mg total) by mouth as needed for migraine. May repeat in 2 hours if needed 03/08/21   Tereasa Coop  M, NP    Physical Exam Vitals: Blood pressure 112/68, pulse 90, height 5\' 1"  (1.549 m), weight 215 lb (97.5 kg), last menstrual period 06/05/2021.  General: NAD HEENT: normocephalic, anicteric Thyroid: no enlargement, no palpable nodules Pulmonary: No increased work of breathing, CTAB Cardiovascular: RRR, distal pulses 2+ Breast: Breast symmetrical, no tenderness, no palpable nodules or masses, no skin or nipple retraction present, no nipple discharge.  No axillary or supraclavicular lymphadenopathy. Abdomen: NABS, soft, non-tender, non-distended.  Umbilicus without lesions.  No hepatomegaly, splenomegaly or masses palpable. No evidence of hernia  Genitourinary:  External: Normal external female genitalia.  Normal urethral  meatus, normal Bartholin's and Skene's glands.    Vagina: Normal vaginal mucosa, no evidence of prolapse.    Cervix: Grossly normal in appearance, no bleeding  Uterus: Non-enlarged, mobile, normal contour.  No CMT  Adnexa: ovaries non-enlarged, no adnexal masses  Rectal: deferred  Lymphatic: no evidence of inguinal lymphadenopathy Extremities: no edema, erythema, or tenderness Neurologic: Grossly intact Psychiatric: mood appropriate, affect full  Female chaperone present for pelvic and breast  portions of the physical exam    Assessment: 30 y.o. G1P1001 routine annual exam  Plan: Problem List Items Addressed This Visit   None Visit Diagnoses     Encounter for gynecological examination without abnormal finding    -  Primary   Screening for malignant neoplasm of cervix       Relevant Orders   Cytology - PAP   Encounter for preconception consultation       Relevant Orders   17-Hydroxyprogesterone   Androstenedione   DHEA-sulfate   FSH/LH   Hemoglobin A1c   Testosterone,Free and Total   Prolactin   TSH   Infertility, female       Relevant Orders   17-Hydroxyprogesterone   Androstenedione   DHEA-sulfate   FSH/LH   Hemoglobin A1c   Testosterone,Free and Total   Prolactin   TSH   Laboratory exam ordered as part of routine general medical examination       Relevant Orders   17-Hydroxyprogesterone   Androstenedione   DHEA-sulfate   FSH/LH   Hemoglobin A1c   Testosterone,Free and Total   Prolactin   TSH   History of PCOS       Relevant Orders   37 PELVIC COMPLETE WITH TRANSVAGINAL       1) STI screening  was notoffered and therefore not obtained  2)  ASCCP guidelines and rational discussed.  Patient opts for every 3 years screening interval  3) Contraception - the patient is currently using  none.  She is attempting to conceive in the near future - given 1 year since IUD removal will start infertility work up - labs and ultrasound ordered - if normal labs  and ultrasound, semen analysis and HSG discussed  4) Routine healthcare maintenance including cholesterol, diabetes screening discussed managed by PCP  5) Return in 12 days (on 06/26/2021) for Progesterone lab, then follow up after [facility] ultrasound.   [facility], MD, [facility], [facility] 06/14/2021, 3:24 PM

## 2021-06-19 LAB — HEMOGLOBIN A1C
Est. average glucose Bld gHb Est-mCnc: 111 mg/dL
Hgb A1c MFr Bld: 5.5 % (ref 4.8–5.6)

## 2021-06-19 LAB — FSH/LH
FSH: 4.9 m[IU]/mL
LH: 8.9 m[IU]/mL

## 2021-06-19 LAB — PROLACTIN: Prolactin: 15.3 ng/mL (ref 4.8–23.3)

## 2021-06-19 LAB — CYTOLOGY - PAP: Diagnosis: NEGATIVE

## 2021-06-19 LAB — TESTOSTERONE,FREE AND TOTAL
Testosterone, Free: 2 pg/mL (ref 0.0–4.2)
Testosterone: 32 ng/dL (ref 13–71)

## 2021-06-19 LAB — ANDROSTENEDIONE: Androstenedione: 157 ng/dL (ref 41–262)

## 2021-06-19 LAB — DHEA-SULFATE: DHEA-SO4: 277 ug/dL (ref 84.8–378.0)

## 2021-06-19 LAB — 17-HYDROXYPROGESTERONE: 17-Hydroxyprogesterone: 32 ng/dL

## 2021-06-19 LAB — TSH: TSH: 0.899 u[IU]/mL (ref 0.450–4.500)

## 2021-06-23 ENCOUNTER — Ambulatory Visit
Admission: RE | Admit: 2021-06-23 | Discharge: 2021-06-23 | Disposition: A | Discharge status: Home / Self Care | Payer: BC Managed Care – PPO | Source: Ambulatory Visit | Attending: Obstetrics and Gynecology | Admitting: Obstetrics and Gynecology

## 2021-06-23 ENCOUNTER — Other Ambulatory Visit: Payer: Self-pay

## 2021-06-23 DIAGNOSIS — Z8742 Personal history of other diseases of the female genital tract: Secondary | ICD-10-CM | POA: Diagnosis present

## 2021-06-27 ENCOUNTER — Other Ambulatory Visit: Payer: BC Managed Care – PPO

## 2021-07-03 ENCOUNTER — Encounter: Payer: Self-pay | Admitting: Obstetrics and Gynecology

## 2021-07-05 ENCOUNTER — Other Ambulatory Visit: Payer: Self-pay

## 2021-07-05 ENCOUNTER — Encounter: Payer: Self-pay | Admitting: Obstetrics and Gynecology

## 2021-07-05 ENCOUNTER — Ambulatory Visit (INDEPENDENT_AMBULATORY_CARE_PROVIDER_SITE_OTHER): Payer: BC Managed Care – PPO | Admitting: Obstetrics and Gynecology

## 2021-07-05 VITALS — BP 112/81 | Wt 214.0 lb

## 2021-07-05 DIAGNOSIS — E282 Polycystic ovarian syndrome: Secondary | ICD-10-CM | POA: Diagnosis not present

## 2021-07-05 MED ORDER — LETROZOLE 2.5 MG PO TABS: 2.5000 mg | ORAL_TABLET | Freq: Every day | ORAL | 1 refills | Status: AC

## 2021-07-05 NOTE — Progress Notes (Signed)
Gynecology Ultrasound Follow Up  Chief Complaint:  Chief Complaint  Patient presents with   Follow-up    Ultrasound - RM 2     History of Present Illness: Patient is a 31 y.o. female who presents today for ultrasound evaluation of infertility.  Ultrasound demonstrates the following findgins Adnexa: no masses seen, multiple follicles noted on both ovaries with ovarian volume >10cm^3 Uterus: No uterine fibroids or structural abnormaliteis with endometrial stripe  no evidence of focal lesion Additional: no free fluid  Review of Systems: Review of Systems  Constitutional: Negative.   Gastrointestinal: Negative.   Genitourinary: Negative.    Past Medical History:  Past Medical History:  Diagnosis Date   Calculus of kidney    Chlamydia    Polycystic ovaries     Past Surgical History:  Past Surgical History:  Procedure Laterality Date   CESAREAN SECTION     CHOLECYSTECTOMY, LAPAROSCOPIC     WISDOM TOOTH EXTRACTION      Gynecologic History:  Patient's last menstrual period was 06/05/2021. Contraception: none Last Pap: 06/14/2021 Results were: .no abnormalities  Family History:  Family History  Problem Relation Age of Onset   Hypertension Mother    Hypertension Father    Breast cancer Maternal Aunt        not sure of age    Social History:  Social History   Socioeconomic History   Marital status: Single    Spouse name: Not on file   Number of children: Not on file   Years of education: Not on file   Highest education level: Not on file  Occupational History   Not on file  Tobacco Use   Smoking status: Never   Smokeless tobacco: Never  Vaping Use   Vaping Use: Never used  Substance and Sexual Activity   Alcohol use: Yes    Alcohol/week: 1.0 standard drink    Types: 1 Glasses of wine per week   Drug use: Never   Sexual activity: Yes    Partners: Male    Birth control/protection: None  Other Topics Concern   Not on file  Social History Narrative    Not on file   Social Determinants of Health   Financial Resource Strain: Not on file  Food Insecurity: Not on file  Transportation Needs: Not on file  Physical Activity: Not on file  Stress: Not on file  Social Connections: Not on file  Intimate Partner Violence: Not on file    Allergies:  No Known Allergies  Medications: Prior to Admission medications   Medication Sig Start Date End Date Taking? Authorizing Provider  ondansetron (ZOFRAN) 4 MG tablet Take 1 tablet (4 mg total) by mouth every 8 (eight) hours as needed for nausea or vomiting. 05/30/21   Jacky Kindle, FNP  rizatriptan (MAXALT) 5 MG tablet Take 1 tablet (5 mg total) by mouth as needed for migraine. May repeat in 2 hours if needed 03/08/21   Freddy Finner, NP    Physical Exam Vitals: Blood pressure 112/81, weight 214 lb (97.1 kg), last menstrual period 06/05/2021.  General: NAD HEENT: normocephalic, anicteric Pulmonary: No increased work of breathing Extremities: no edema, erythema, or tenderness Neurologic: Grossly intact, normal gait Psychiatric: mood appropriate, affect full  Recent Results (from the past 2160 hour(s))  POCT urine pregnancy     Status: None   Collection Time: 05/30/21 11:55 AM  Result Value Ref Range   Preg Test, Ur Negative Negative  Cytology - PAP  Status: None   Collection Time: 06/14/21  3:24 PM  Result Value Ref Range   Adequacy      Satisfactory for evaluation; transformation zone component PRESENT.   Diagnosis      - Negative for intraepithelial lesion or malignancy (NILM)  17-Hydroxyprogesterone     Status: None   Collection Time: 06/14/21  4:03 PM  Result Value Ref Range   17-Hydroxyprogesterone 32 ng/dL    Comment:                           Adult Female                            Follicular        15 -  70                            Luteal            35 - 290   Androstenedione     Status: None   Collection Time: 06/14/21  4:03 PM  Result Value Ref Range    Androstenedione 157 41 - 262 ng/dL    Comment: This test was developed and its performance characteristics determined by [facility]. It has not been cleared or approved by the Food and Drug Administration.   DHEA-sulfate     Status: None   Collection Time: 06/14/21  4:03 PM  Result Value Ref Range   DHEA-SO4 277.0 84.8 - 378.0 ug/dL  FSH/LH     Status: None   Collection Time: 06/14/21  4:03 PM  Result Value Ref Range   LH 8.9 mIU/mL    Comment:                     Adult Female:                       Follicular phase      2.4 -  12.6                       Ovulation phase      14.0 -  95.6                       Luteal phase          1.0 -  11.4                       Postmenopausal        7.7 -  58.5    FSH 4.9 mIU/mL    Comment:                     Adult Female:                       Follicular phase      3.5 -  12.5                       Ovulation phase       4.7 -  21.5                       Luteal phase          1.7 -   7.7  Postmenopausal       25.8 - 134.8   Hemoglobin A1c     Status: None   Collection Time: 06/14/21  4:03 PM  Result Value Ref Range   Hgb A1c MFr Bld 5.5 4.8 - 5.6 %    Comment:          Prediabetes: 5.7 - 6.4          Diabetes: >6.4          Glycemic control for adults with diabetes: <7.0    Est. average glucose Bld gHb Est-mCnc 111 mg/dL  Testosterone,Free and Total     Status: None   Collection Time: 06/14/21  4:03 PM  Result Value Ref Range   Testosterone 32 13 - 71 ng/dL   Testosterone, Free 2.0 0.0 - 4.2 pg/mL  Prolactin     Status: None   Collection Time: 06/14/21  4:03 PM  Result Value Ref Range   Prolactin 15.3 4.8 - 23.3 ng/mL  TSH     Status: None   Collection Time: 06/14/21  4:03 PM  Result Value Ref Range   TSH 0.899 0.450 - 4.500 uIU/mL    US PELVIC COMPLETE WITH TRANSVAGINAL  Result Date: 06/23/2021 CLINICAL DATA:  History of polycystic ovarian syndrome, LMP 06/05/2021 EXAM: TRANSABDOMINAL AND TRANSVAGINAL  ULTRASOUND OF PELVIS TECHNIQUE: Both transabdominal and transvaginal ultrasound examinations of the pelvis were performed. Transabdominal technique was performed for global imaging of the pelvis including uterus, ovaries, adnexal regions, and pelvic cul-de-sac. It was necessary to proceed with endovaginal exam following the transabdominal exam to visualize the endometrium. COMPARISON:  None FINDINGS: Uterus Measurements: 8.9 x 3.4 x 4.1 cm = volume: 66 mL. Anteverted. Slightly heterogeneous myometrium. No focal mass. Multiple nabothian cysts at cervix. Endometrium Thickness: 10 mm.  No endometrial fluid or mass Right ovary Measurements: 3.7 x 2.1 x 2.5 cm = volume: 10.1 mL. Normal morphology without mass Left ovary Measurements: 3.8 x 2.8 x 2.7 cm = volume: 15.4 mL. Normal morphology without mass Other findings No free pelvic fluid.  No adnexal masses. IMPRESSION: Normal exam. Electronically Signed   By: Ulyses SouthwardMark  Boles M.D.   On: 06/23/2021 19:20      Assessment: 31 y.o. G1P1001 follow up ultrasound and labs   Plan: Problem List Items Addressed This Visit   None Visit Diagnoses     PCOS (polycystic ovarian syndrome)    -  Primary   Relevant Orders   Progesterone       1) PCOS - polycystic appearance to ovaries noted.  LH/FSH ratio suggestive of PCOS.  No extended periods of amenorrhea and no evidence of testosterone elevation (clinical or laboratory).  Likely on the PCOS spectrum - start letrozole 2.5mg  po daily anticpated LMP tomorrow so start 07/08/21 and progesterone lab 1/25 - We discussed the underlying etiologies which may be implicated in a couple experiencing difficulty conceiving.  The average couple will conceive within the span of 1 year with unprotected coitus, with a monthly fecundity rate of 20% or 1 in 5.  Even without further work up or intervention the patient and her partner may be successful in conceiving unassisted, although if an underlying etiology can be identified and  addressed fecundity rate may improve.  The work up entails examining for ovulatory function, tubal patency, and ruling out female factor infertility.  These may be looked at concurrently or sequentially.  The downside of sequential work up is that this method may miss issues if more than one compartment is  contributing.  She is aware that tubal factor or moderate to severe female factor infertility will require further consultation with a reproductive endocrinologist.  In the case of anovulation, use of Clomid (clomiphen citrate) or Femara (letrazole) were discussed with the understanding the the later is an off-label, but well supported use.  Current recommendation are to use letrozole first line secondary to increased live birth rate compared to clomid for patients with PCOS ("Polycystic Ovarian Syndrome" ACOG Practice Bulletin 194 June 2018).  With either of these drugs the risk of multiples increases from the standard population rate of 2% to approximately 10%, with higher order multiples possible but unlikely.  Both drugs may require some time to titrate to the appropriate dosage to ensure consistent ovulation.  Cycles will be limited to 6 cycles on each drug secondary to decreasing rates of conception after 6 cycles.  In addition should patient be started on ovulation induction with Clomid she was advised to discontinue the drug for any vision changes as this is a rare but potentially permanent side-effect if medication is continued.  Was counseled that letrozole may cause idiopathic bone pain that generally resolves.  Hot flashes, headaches, and nausea were mentioned as the most commonly encountered side-effects of both drugs.  We discussed timing of intercourse as well as the use of ovulation predictor kits identify the patient's fertile window each month.    - If no conception after 4 ovulatory cycles of letrozole we discussed proceeding with semen analysis and HSG  2) A total of 18 minutes were spent in  face-to-face contact with the patient during this encounter with over half of that time devoted to counseling and coordination of care.  3) Return for progesterone lab 07/26/21.    Vena AustriaAndreas Lizandra Zakrzewski, MD, Evern CoreFACOG [facility], [facility] 07/05/2021, 11:04 AM

## 2021-07-10 ENCOUNTER — Encounter: Payer: Self-pay | Admitting: Obstetrics and Gynecology

## 2021-07-15 ENCOUNTER — Encounter: Payer: Self-pay | Admitting: Emergency Medicine

## 2021-07-15 ENCOUNTER — Ambulatory Visit
Admission: EM | Admit: 2021-07-15 | Discharge: 2021-07-15 | Disposition: A | Discharge status: Home / Self Care | Payer: BC Managed Care – PPO

## 2021-07-15 ENCOUNTER — Other Ambulatory Visit: Payer: Self-pay

## 2021-07-15 DIAGNOSIS — R519 Headache, unspecified: Secondary | ICD-10-CM | POA: Diagnosis not present

## 2021-07-15 MED ORDER — KETOROLAC TROMETHAMINE 30 MG/ML IJ SOLN
30.0000 mg | Freq: Once | INTRAMUSCULAR | Status: AC
  Administered 2021-07-15: 30 mg via INTRAMUSCULAR

## 2021-07-15 NOTE — Discharge Instructions (Addendum)
You were given an injection of Toradol today.  Do not take any over-the-counter medications today.  Go to the emergency department if your symptoms worsen.  Follow up with your primary care provider next week.

## 2021-07-15 NOTE — ED Triage Notes (Signed)
Onset 07/10/2021 of feeling "spaced out".  Patient reports left side of head is hurting, last night left ear was hurting as well.  Denies fever or cough.

## 2021-07-15 NOTE — ED Provider Notes (Signed)
[facility]    CSN: [id number] Arrival date & time: 07/15/21  [id number]      History   Chief Complaint Chief Complaint  Patient presents with   Headache    HPI Nancy Ramos is a 31 y.o. female. Patient present with headache x 6 days. The headache is on left forehead and side of face and head; constant; currently 7/10; at worst 10/10; at best 7/10.  It feels like previous migraines. Treatment attempted with rizatriptan; last dose taken yesterday morning; no improvement.  No known aggravating or alleviating factors.  No associated symptoms.  No weakness, numbness, fever, chills, congestion, sore throat, cough, chest pain, shortness of breath, nausea, vomiting, diarrhea, or other symptoms.  Patient started letrozole on 07/13/2021; She messaged her OB/GYN on 07/14/2021 to report headache, blurred vision, and face puffiness; She was instructed by Dr. Georgianne Fick to stop the medication; Her blurred vision and face puffiness resolved. The headache continues.   The history is provided by the patient and medical records.   Past Medical History:  Diagnosis Date   Calculus of kidney    Chlamydia    Polycystic ovaries     Patient Active Problem List   Diagnosis Date Noted   Exposure to the flu 05/30/2021   Pharyngitis 05/30/2021   Nausea and vomiting 05/30/2021   Menstruation, irregular 05/30/2021   Other fatigue 06/07/2020   Vitamin D deficiency 06/07/2020   IUD (intrauterine device) in place 06/07/2020    Past Surgical History:  Procedure Laterality Date   CESAREAN SECTION     CHOLECYSTECTOMY, LAPAROSCOPIC     WISDOM TOOTH EXTRACTION      OB History     Gravida  1   Para  1   Term  1   Preterm      AB      Living  1      SAB      IAB      Ectopic      Multiple      Live Births  1            Home Medications    Prior to Admission medications   Medication Sig Start Date End Date Taking? Authorizing Provider  letrozole (FEMARA) 2.5 MG tablet  Take 2.5 mg by mouth daily. STARTED THIS ON Thursday 07/13/2021   Yes [provider]  ondansetron (ZOFRAN) 4 MG tablet Take 1 tablet (4 mg total) by mouth every 8 (eight) hours as needed for nausea or vomiting. 05/30/21   Gwyneth Sprout, FNP  rizatriptan (MAXALT) 5 MG tablet Take 1 tablet (5 mg total) by mouth as needed for migraine. May repeat in 2 hours if needed 03/08/21   Perlie Mayo, NP    Family History Family History  Problem Relation Age of Onset   Hypertension Mother    Hypertension Father    Breast cancer Maternal Aunt        not sure of age    Social History Social History   Tobacco Use   Smoking status: Never   Smokeless tobacco: Never  Vaping Use   Vaping Use: Never used  Substance Use Topics   Alcohol use: Yes    Alcohol/week: 1.0 standard drink    Types: 1 Glasses of wine per week   Drug use: Never     Allergies   Patient has no known allergies.   Review of Systems Review of Systems  Constitutional:  Negative for chills and fever.  HENT:  Negative for ear pain and sore throat.   Eyes:  Negative for pain, discharge, redness, itching and visual disturbance.  Respiratory:  Negative for cough and shortness of breath.   Cardiovascular:  Negative for chest pain and palpitations.  Gastrointestinal:  Negative for abdominal pain, diarrhea, nausea and vomiting.  Skin:  Negative for color change and rash.  Neurological:  Positive for headaches. Negative for dizziness, facial asymmetry, speech difficulty, weakness and numbness.  All other systems reviewed and are negative.   Physical Exam Triage Vital Signs ED Triage Vitals  Enc Vitals Group     BP 07/15/21 0853 108/77     Pulse Rate 07/15/21 0853 76     Resp 07/15/21 0853 18     Temp 07/15/21 0853 98.2 F (36.8 C)     Temp src --      SpO2 07/15/21 0853 98 %     Weight --      Height --      Head Circumference --      Peak Flow --      Pain Score 07/15/21 0856 8     Pain Loc --      Pain  Edu? --      Excl. in [place]? --    No data found.  Updated Vital Signs BP 108/77 (BP Location: Left Arm)    Pulse 76    Temp 98.2 F (36.8 C)    Resp 18    LMP 07/11/2021    SpO2 98%   Visual Acuity Right Eye Distance:   Left Eye Distance:   Bilateral Distance:    Right Eye Near:   Left Eye Near:    Bilateral Near:     Physical Exam Vitals and nursing note reviewed.  Constitutional:      General: She is not in acute distress.    Appearance: Normal appearance. She is well-developed. She is not ill-appearing.     Comments: Well-appearing, no acute distress.   HENT:     Right Ear: Tympanic membrane normal.     Left Ear: Tympanic membrane normal.     Nose: Nose normal.     Mouth/Throat:     Mouth: Mucous membranes are moist.     Pharynx: Oropharynx is clear.  Eyes:     Extraocular Movements: Extraocular movements intact.     Conjunctiva/sclera: Conjunctivae normal.     Pupils: Pupils are equal, round, and reactive to light.  Cardiovascular:     Rate and Rhythm: Normal rate and regular rhythm.     Heart sounds: Normal heart sounds.  Pulmonary:     Effort: Pulmonary effort is normal. No respiratory distress.     Breath sounds: Normal breath sounds.  Abdominal:     Palpations: Abdomen is soft.     Tenderness: There is no abdominal tenderness.  Musculoskeletal:        General: No swelling.     Cervical back: Neck supple.  Skin:    General: Skin is warm and dry.     Capillary Refill: Capillary refill takes less than 2 seconds.     Findings: No erythema or rash.  Neurological:     General: No focal deficit present.     Mental Status: She is alert and oriented to person, place, and time.     Cranial Nerves: No cranial nerve deficit.     Sensory: No sensory deficit.     Motor: No weakness.     Gait: Gait normal.  Psychiatric:        Mood and Affect: Mood normal.        Behavior: Behavior normal.     UC Treatments / Results  Labs (all labs ordered are listed, but  only abnormal results are displayed) Labs Reviewed - No data to display  EKG   Radiology No results found.  Procedures Procedures (including critical care time)  Medications Ordered in UC Medications  ketorolac (TORADOL) 30 MG/ML injection 30 mg (has no administration in time range)    Initial Impression / Assessment and Plan / UC Course  I have reviewed the triage vital signs and the nursing notes.  Pertinent labs & imaging results that were available during my care of the patient were reviewed by me and considered in my medical decision making (see chart for details).    Acute non-intractable headache.  Patient is well-appearing and her exam is reassuring.  Vital signs stable.  No medications taken today.  Treating today with Toradol.  Instructed patient to avoid OTC medications today except acetaminophen if needed.  ED precautions discussed.  Instructed patient to follow-up with her PCP next week.  Instructed her to follow-up with her OB/GYN on Monday to further discuss the letrozole as patient has questions about continuing or discontinuing it.  She agrees to plan of care.    Final Clinical Impressions(s) / UC Diagnoses   Final diagnoses:  Acute nonintractable headache, unspecified headache type     Discharge Instructions      You were given an injection of Toradol today.  Do not take any over-the-counter medications today.  Go to the emergency department if your symptoms worsen.  Follow up with your primary care provider next week.        ED Prescriptions   None    PDMP not reviewed this encounter.   Sharion Balloon, NP 07/15/21 [phone]

## 2021-07-26 ENCOUNTER — Other Ambulatory Visit: Payer: BC Managed Care – PPO

## 2021-07-26 ENCOUNTER — Other Ambulatory Visit: Payer: Self-pay

## 2021-07-26 DIAGNOSIS — E282 Polycystic ovarian syndrome: Secondary | ICD-10-CM

## 2021-07-27 ENCOUNTER — Telehealth: Payer: Self-pay

## 2021-07-27 LAB — PROGESTERONE: Progesterone: 0.1 ng/mL

## 2021-07-27 NOTE — Telephone Encounter (Signed)
Pt calling; had labs drawn yesterday; needs someone to explain them to her.  [phone]

## 2021-08-04 ENCOUNTER — Ambulatory Visit
Admission: EM | Admit: 2021-08-04 | Discharge: 2021-08-04 | Disposition: A | Discharge status: Home / Self Care | Payer: BC Managed Care – PPO | Attending: Emergency Medicine | Admitting: Emergency Medicine

## 2021-08-04 ENCOUNTER — Other Ambulatory Visit: Payer: Self-pay

## 2021-08-04 ENCOUNTER — Ambulatory Visit: Payer: Self-pay

## 2021-08-04 DIAGNOSIS — R0981 Nasal congestion: Secondary | ICD-10-CM

## 2021-08-04 DIAGNOSIS — H6690 Otitis media, unspecified, unspecified ear: Secondary | ICD-10-CM | POA: Diagnosis not present

## 2021-08-04 DIAGNOSIS — J029 Acute pharyngitis, unspecified: Secondary | ICD-10-CM | POA: Diagnosis not present

## 2021-08-04 MED ORDER — FLUTICASONE PROPIONATE 50 MCG/ACT NA SUSP: 1.0000 | Freq: Every day | NASAL | 2 refills | Status: AC

## 2021-08-04 MED ORDER — PREDNISONE 10 MG (21) PO TBPK: ORAL_TABLET | Freq: Every day | ORAL | 0 refills | Status: DC

## 2021-08-04 NOTE — ED Triage Notes (Signed)
Patient presents to Urgent Care with complaints of sore throat and loss of voice since 2 days ago. Treating sore throat with robitussin. She states she is unsure if related to the ear discomfort she was seen 01/14.  Denies fever.

## 2021-08-04 NOTE — ED Provider Notes (Signed)
Roderic Palau    CSN: [id number] Arrival date & time: 08/04/21  1314      History   Chief Complaint Chief Complaint  Patient presents with   Sore Throat    Loss of voice     HPI Nancy Ramos is a 31 y.o. female.   Patient presents today with sore throat nasal congestion and has lost her voice since last night.  Patient denies any fevers no nausea vomiting or diarrhea.  Has not taken anything prior to arrival.   Past Medical History:  Diagnosis Date   Calculus of kidney    Chlamydia    Polycystic ovaries     Patient Active Problem List   Diagnosis Date Noted   Exposure to the flu 05/30/2021   Pharyngitis 05/30/2021   Nausea and vomiting 05/30/2021   Menstruation, irregular 05/30/2021   Other fatigue 06/07/2020   Vitamin D deficiency 06/07/2020   IUD (intrauterine device) in place 06/07/2020    Past Surgical History:  Procedure Laterality Date   CESAREAN SECTION     CHOLECYSTECTOMY, LAPAROSCOPIC     WISDOM TOOTH EXTRACTION      OB History     Gravida  1   Para  1   Term  1   Preterm      AB      Living  1      SAB      IAB      Ectopic      Multiple      Live Births  1            Home Medications    Prior to Admission medications   Medication Sig Start Date End Date Taking? Authorizing Provider  fluticasone (FLONASE) 50 MCG/ACT nasal spray Place 1 spray into both nostrils daily. 08/04/21  Yes Marney Setting, NP  predniSONE (STERAPRED UNI-PAK 21 TAB) 10 MG (21) TBPK tablet Take by mouth daily. Take 6 tabs by mouth daily  for 2 days, then 5 tabs for 2 days, then 4 tabs for 2 days, then 3 tabs for 2 days, 2 tabs for 2 days, then 1 tab by mouth daily for 2 days 08/04/21  Yes Marney Setting, NP  letrozole [facility]) 2.5 MG tablet Take 2.5 mg by mouth daily. STARTED THIS ON Thursday 07/13/2021    [provider]  ondansetron (ZOFRAN) 4 MG tablet Take 1 tablet (4 mg total) by mouth every 8 (eight) hours as needed  for nausea or vomiting. 05/30/21   Gwyneth Sprout, FNP  rizatriptan (MAXALT) 5 MG tablet Take 1 tablet (5 mg total) by mouth as needed for migraine. May repeat in 2 hours if needed 03/08/21   Perlie Mayo, NP    Family History Family History  Problem Relation Age of Onset   Hypertension Mother    Hypertension Father    Breast cancer Maternal Aunt        not sure of age    Social History Social History   Tobacco Use   Smoking status: Never   Smokeless tobacco: Never  Vaping Use   Vaping Use: Never used  Substance Use Topics   Alcohol use: Yes    Alcohol/week: 1.0 standard drink    Types: 1 Glasses of wine per week   Drug use: Never     Allergies   Patient has no known allergies.   Review of Systems Review of Systems  Constitutional:  Negative for chills, fatigue and fever.  HENT:  Positive for congestion, ear pain, postnasal drip, sore throat and voice change.   Respiratory:  Positive for cough. Negative for shortness of breath and wheezing.   Cardiovascular: Negative.   Gastrointestinal: Negative.   Genitourinary: Negative.   Neurological: Negative.     Physical Exam Triage Vital Signs ED Triage Vitals  Enc Vitals Group     BP 08/04/21 1339 104/70     Pulse Rate 08/04/21 1339 84     Resp 08/04/21 1324 16     Temp 08/04/21 1324 97.7 F (36.5 C)     Temp Source 08/04/21 1324 Temporal     SpO2 08/04/21 1339 98 %     Weight --      Height --      Head Circumference --      Peak Flow --      Pain Score --      Pain Loc --      Pain Edu? --      Excl. in [place]? --    No data found.  Updated Vital Signs BP 104/70 (BP Location: Left Arm)    Pulse 84    Temp (!) 96.5 F (35.8 C) (Temporal)    Resp 16    LMP 07/11/2021    SpO2 98%   Visual Acuity Right Eye Distance:   Left Eye Distance:   Bilateral Distance:    Right Eye Near:   Left Eye Near:    Bilateral Near:     Physical Exam Constitutional:      Appearance: She is well-developed.  HENT:      Right Ear: A middle ear effusion is present.     Left Ear: A middle ear effusion is present.     Nose: Congestion present.     Mouth/Throat:     Mouth: Mucous membranes are moist. No oral lesions.     Pharynx: Posterior oropharyngeal erythema present. No pharyngeal swelling, oropharyngeal exudate or uvula swelling.     Tonsils: No tonsillar exudate.  Cardiovascular:     Rate and Rhythm: Normal rate.  Pulmonary:     Effort: Pulmonary effort is normal.  Abdominal:     Palpations: Abdomen is soft.  Musculoskeletal:     Cervical back: Normal range of motion.  Neurological:     Mental Status: She is alert.     UC Treatments / Results  Labs (all labs ordered are listed, but only abnormal results are displayed) Labs Reviewed - No data to display  EKG   Radiology No results found.  Procedures Procedures (including critical care time)  Medications Ordered in UC Medications - No data to display  Initial Impression / Assessment and Plan / UC Course  I have reviewed the triage vital signs and the nursing notes.  Pertinent labs & imaging results that were available during my care of the patient were reviewed by me and considered in my medical decision making (see chart for details).     Taking Zyrtec or Claritin daily Your symptoms are more viral in nature Continue to take over-the-counter cough and cold medications as needed Use throat lozenges or cough drops to help soothe the throat If symptoms become worse follow-up with your PCP or return Final Clinical Impressions(s) / UC Diagnoses   Final diagnoses:  Acute pharyngitis, unspecified etiology  Acute otitis media, unspecified otitis media type  Nasal congestion     Discharge Instructions      Taking Zyrtec or Claritin daily Your symptoms are more  viral in nature Continue to take over-the-counter cough and cold medications as needed Use throat lozenges or cough drops to help soothe the throat If symptoms become  worse follow-up with your PCP or return    ED Prescriptions     Medication Sig Dispense Auth. Provider   fluticasone (FLONASE) 50 MCG/ACT nasal spray Place 1 spray into both nostrils daily. 16 g Morley Kos L, NP   predniSONE (STERAPRED UNI-PAK 21 TAB) 10 MG (21) TBPK tablet Take by mouth daily. Take 6 tabs by mouth daily  for 2 days, then 5 tabs for 2 days, then 4 tabs for 2 days, then 3 tabs for 2 days, 2 tabs for 2 days, then 1 tab by mouth daily for 2 days 42 tablet Marney Setting, NP      PDMP not reviewed this encounter.   Marney Setting, NP 08/04/21 1357

## 2021-08-04 NOTE — Discharge Instructions (Addendum)
Taking Zyrtec or Claritin daily Your symptoms are more viral in nature Continue to take over-the-counter cough and cold medications as needed Use throat lozenges or cough drops to help soothe the throat If symptoms become worse follow-up with your PCP or return

## 2021-08-06 ENCOUNTER — Other Ambulatory Visit: Payer: Self-pay

## 2021-08-06 ENCOUNTER — Ambulatory Visit
Admission: EM | Admit: 2021-08-06 | Discharge: 2021-08-06 | Disposition: A | Discharge status: Home / Self Care | Payer: BC Managed Care – PPO | Attending: Family Medicine | Admitting: Family Medicine

## 2021-08-06 ENCOUNTER — Encounter: Payer: Self-pay | Admitting: Emergency Medicine

## 2021-08-06 DIAGNOSIS — Z1152 Encounter for screening for COVID-19: Secondary | ICD-10-CM | POA: Diagnosis not present

## 2021-08-06 DIAGNOSIS — J069 Acute upper respiratory infection, unspecified: Secondary | ICD-10-CM | POA: Diagnosis not present

## 2021-08-06 DIAGNOSIS — G43819 Other migraine, intractable, without status migrainosus: Secondary | ICD-10-CM

## 2021-08-06 LAB — POCT URINE PREGNANCY: Preg Test, Ur: NEGATIVE

## 2021-08-06 LAB — POCT RAPID STREP A (OFFICE): Rapid Strep A Screen: NEGATIVE

## 2021-08-06 MED ORDER — TIZANIDINE HCL 2 MG PO TABS: 2.0000 mg | ORAL_TABLET | Freq: Two times a day (BID) | ORAL | 0 refills | Status: DC | PRN

## 2021-08-06 MED ORDER — RIZATRIPTAN BENZOATE 5 MG PO TABS: 5.0000 mg | ORAL_TABLET | ORAL | 0 refills | Status: DC | PRN

## 2021-08-06 MED ORDER — PROMETHAZINE-DM 6.25-15 MG/5ML PO SYRP: 5.0000 mL | ORAL_SOLUTION | Freq: Four times a day (QID) | ORAL | 0 refills | Status: DC | PRN

## 2021-08-06 NOTE — Discharge Instructions (Signed)
Your pregnancy test is negative. Continue prednisone. For headaches start rizatriptan and I've added tizanidine for acute headache ( medication can cause dizziness but relieve tension associated with headache pain).  Your COVID test results normal result within 3 days

## 2021-08-06 NOTE — ED Provider Notes (Signed)
Roderic Palau    CSN: [id number] Arrival date & time: 08/06/21  1111      History   Chief Complaint Chief Complaint  Patient presents with   Laryngitis   Sore Throat   Cough   Headache   Otalgia    HPI Nancy Ramos is a 31 y.o. female.   HPI Patient seen here at urgent care 2 days ago for the same symptoms that she is presenting here for evaluation today.  Patient continues to have loss of voice, sore throat, cough, headache and otalgia.  Patient was prescribed a tapered dose of prednisone, advised to continue antihistamine, advised to start Flonase.  She did not have any viral testing at that time.  She is afebrile at present. She endorses feeling worse since she was seen two days ago. She did start prednisone but is concern today that she may be pregnant as her period is a few days late.  Denies any vomiting, SOB, or CP. Patient is complaining of significant headache and needs a refill of migraine medication. She continues to have ear pressure and pain in throat. Past Medical History:  Diagnosis Date   Calculus of kidney    Chlamydia    Polycystic ovaries     Patient Active Problem List   Diagnosis Date Noted   Exposure to the flu 05/30/2021   Pharyngitis 05/30/2021   Nausea and vomiting 05/30/2021   Menstruation, irregular 05/30/2021   Other fatigue 06/07/2020   Vitamin D deficiency 06/07/2020   IUD (intrauterine device) in place 06/07/2020    Past Surgical History:  Procedure Laterality Date   CESAREAN SECTION     CHOLECYSTECTOMY, LAPAROSCOPIC     WISDOM TOOTH EXTRACTION      OB History     Gravida  1   Para  1   Term  1   Preterm      AB      Living  1      SAB      IAB      Ectopic      Multiple      Live Births  1            Home Medications    Prior to Admission medications   Medication Sig Start Date End Date Taking? Authorizing Provider  tiZANidine (ZANAFLEX) 2 MG tablet Take 1 tablet (2 mg total) by mouth 2  (two) times daily as needed for muscle spasms. 08/06/21  Yes Scot Jun, FNP  fluticasone (FLONASE) 50 MCG/ACT nasal spray Place 1 spray into both nostrils daily. 08/04/21   Marney Setting, NP  letrozole [facility]) 2.5 MG tablet Take 2.5 mg by mouth daily. STARTED THIS ON Thursday 07/13/2021    [provider]  ondansetron (ZOFRAN) 4 MG tablet Take 1 tablet (4 mg total) by mouth every 8 (eight) hours as needed for nausea or vomiting. 05/30/21   Gwyneth Sprout, FNP  predniSONE (STERAPRED UNI-PAK 21 TAB) 10 MG (21) TBPK tablet Take by mouth daily. Take 6 tabs by mouth daily  for 2 days, then 5 tabs for 2 days, then 4 tabs for 2 days, then 3 tabs for 2 days, 2 tabs for 2 days, then 1 tab by mouth daily for 2 days 08/04/21   Marney Setting, NP  rizatriptan (MAXALT) 5 MG tablet Take 1 tablet (5 mg total) by mouth as needed for migraine. May repeat in 2 hours if needed 08/06/21   Scot Jun,  FNP    Family History Family History  Problem Relation Age of Onset   Hypertension Mother    Hypertension Father    Breast cancer Maternal Aunt        not sure of age    Social History Social History   Tobacco Use   Smoking status: Never   Smokeless tobacco: Never  Vaping Use   Vaping Use: Never used  Substance Use Topics   Alcohol use: Yes    Alcohol/week: 1.0 standard drink    Types: 1 Glasses of wine per week   Drug use: Never     Allergies   Patient has no known allergies.   Review of Systems Review of Systems Pertinent negatives listed in HPI   Physical Exam Triage Vital Signs ED Triage Vitals  Enc Vitals Group     BP 08/06/21 1132 133/87     Pulse Rate 08/06/21 1132 87     Resp 08/06/21 1132 16     Temp 08/06/21 1132 98.2 F (36.8 C)     Temp Source 08/06/21 1132 Oral     SpO2 08/06/21 1132 97 %     Weight --      Height --      Head Circumference --      Peak Flow --      Pain Score 08/06/21 1139 7     Pain Loc --      Pain Edu? --      Excl. in  [place]? --    No data found.  Updated Vital Signs BP 133/87 (BP Location: Left Arm)    Pulse 87    Temp 98.2 F (36.8 C) (Oral)    Resp 16    LMP 07/11/2021    SpO2 97%   Visual Acuity Right Eye Distance:   Left Eye Distance:   Bilateral Distance:    Right Eye Near:   Left Eye Near:    Bilateral Near:     Physical Exam Constitutional:      Appearance: She is ill-appearing.  HENT:     Head: Normocephalic and atraumatic.     Right Ear: A middle ear effusion is present. Tympanic membrane is not erythematous.     Left Ear: A middle ear effusion is present. Tympanic membrane is not erythematous.     Nose: Congestion present.     Mouth/Throat:     Pharynx: Posterior oropharyngeal erythema present. No pharyngeal swelling or oropharyngeal exudate.     Tonsils: No tonsillar exudate.  Eyes:     Conjunctiva/sclera: Conjunctivae normal.     Pupils: Pupils are equal, round, and reactive to light.  Cardiovascular:     Rate and Rhythm: Normal rate and regular rhythm.  Pulmonary:     Effort: Pulmonary effort is normal.     Breath sounds: Normal breath sounds.  Lymphadenopathy:     Cervical: Cervical adenopathy present.  Skin:    General: Skin is warm.     Capillary Refill: Capillary refill takes less than 2 seconds.  Neurological:     General: No focal deficit present.     Mental Status: She is alert and oriented to person, place, and time.     UC Treatments / Results  Labs (all labs ordered are listed, but only abnormal results are displayed) Labs Reviewed  NOVEL CORONAVIRUS, NAA  POCT RAPID STREP A (OFFICE)  POCT URINE PREGNANCY    EKG   Radiology No results found.  Procedures Procedures (including critical care time)  Medications Ordered in UC Medications - No data to display  Initial Impression / Assessment and Plan / UC Course  I have reviewed the triage vital signs and the nursing notes.  Pertinent labs & imaging results that were available during my care of  the patient were reviewed by me and considered in my medical decision making (see chart for details).     COVID test pending. Continue current symptom management warranted only.  Manage fever with Tylenol and ibuprofen.  Promethazine DM for cough. Nasal symptoms with over-the-counter antihistamines recommended.   Headache, likely due to severe congestion induced migraine, refilled Maxalt and added Tizanidine. Red flags/ER precautions given. The most current CDC isolation/quarantine recommendation advised.   Final Clinical Impressions(s) / UC Diagnoses   Final diagnoses:  Other migraine without status migrainosus, intractable  Encounter for screening for COVID-19  Viral URI     Discharge Instructions      Your pregnancy test is negative. Continue prednisone. For headaches start rizatriptan and I've added tizanidine for acute headache ( medication can cause dizziness but relieve tension associated with headache pain).  Your COVID test results normal result within 3 days     ED Prescriptions     Medication Sig Dispense Auth. Provider   rizatriptan (MAXALT) 5 MG tablet Take 1 tablet (5 mg total) by mouth as needed for migraine. May repeat in 2 hours if needed 10 tablet Scot Jun, FNP   tiZANidine (ZANAFLEX) 2 MG tablet Take 1 tablet (2 mg total) by mouth 2 (two) times daily as needed for muscle spasms. 30 tablet Scot Jun, FNP   promethazine-dextromethorphan (PROMETHAZINE-DM) 6.25-15 MG/5ML syrup Take 5 mLs by mouth 4 (four) times daily as needed for cough. 140 mL Scot Jun, FNP      PDMP not reviewed this encounter.   Scot Jun, FNP 08/06/21 1234

## 2021-08-06 NOTE — ED Triage Notes (Signed)
Pt presents with laryngitis, cough, ST, left ear pain, and HA x 5 days

## 2021-08-07 LAB — NOVEL CORONAVIRUS, NAA: SARS-CoV-2, NAA: DETECTED — AB

## 2021-08-07 LAB — SARS-COV-2, NAA 2 DAY TAT

## 2021-08-08 ENCOUNTER — Ambulatory Visit: Payer: BC Managed Care – PPO | Admitting: Family Medicine

## 2021-08-14 ENCOUNTER — Other Ambulatory Visit: Payer: Self-pay | Admitting: Family Medicine

## 2021-08-14 MED ORDER — LETROZOLE 2.5 MG PO TABS: 2.5000 mg | ORAL_TABLET | Freq: Every day | ORAL | 0 refills | Status: DC

## 2021-08-19 ENCOUNTER — Other Ambulatory Visit: Payer: Self-pay | Admitting: Family Medicine

## 2021-10-03 ENCOUNTER — Ambulatory Visit: Payer: BC Managed Care – PPO | Admitting: Family Medicine

## 2021-10-03 NOTE — Progress Notes (Signed)
?  ? ? ?[employer] as a Neurosurgeon for Jacky Kindle, FNP.,have documented all relevant documentation on the behalf of Jacky Kindle, FNP,as directed by  Jacky Kindle, FNP while in the presence of Jacky Kindle, FNP.  ? ?Established patient visit ? ? ?Patient: Nancy Ramos   DOB: 12/18/1990   30 y.o. Female  MRN: [id number] ?Visit Date: 10/04/2021 ? ?Today's healthcare provider: Jacky Kindle, FNP  ? ?Re Introduced to nurse practitioner role and practice setting.  All questions answered.  Discussed provider/patient relationship and expectations. ? ? ?Chief Complaint  ?Patient presents with  ? Rash  ? ?Subjective  ?  ?Rash ?This is a recurrent problem. The affected locations include the head. The rash is characterized by redness and itchiness. Pertinent negatives include no anorexia, congestion, cough, diarrhea, eye pain, facial edema, fatigue, fever, joint pain, nail changes, rhinorrhea, shortness of breath, sore throat or vomiting. Treatments tried: neosporin. The treatment provided no relief.   ? ? ?Medications: ?Outpatient Medications Prior to Visit  ?Medication Sig  ? fluticasone (FLONASE) 50 MCG/ACT nasal spray Place 1 spray into both nostrils daily.  ? letrozole (FEMARA) 2.5 MG tablet Take 1 tablet (2.5 mg total) by mouth daily. STARTED THIS ON Thursday 07/13/2021  ? ondansetron (ZOFRAN) 4 MG tablet Take 1 tablet (4 mg total) by mouth every 8 (eight) hours as needed for nausea or vomiting.  ? rizatriptan (MAXALT) 5 MG tablet Take 1 tablet (5 mg total) by mouth as needed for migraine. May repeat in 2 hours if needed  ? tiZANidine (ZANAFLEX) 2 MG tablet Take 1 tablet (2 mg total) by mouth 2 (two) times daily as needed for muscle spasms.  ? [DISCONTINUED] predniSONE (STERAPRED UNI-PAK 21 TAB) 10 MG (21) TBPK tablet Take by mouth daily. Take 6 tabs by mouth daily  for 2 days, then 5 tabs for 2 days, then 4 tabs for 2 days, then 3 tabs for 2 days, 2 tabs for 2 days, then 1 tab by mouth daily for  2 days  ? [DISCONTINUED] promethazine-dextromethorphan (PROMETHAZINE-DM) 6.25-15 MG/5ML syrup Take 5 mLs by mouth 4 (four) times daily as needed for cough.  ? ?No facility-administered medications prior to visit.  ? ? ?Review of Systems  ?Constitutional:  Negative for fatigue and fever.  ?HENT:  Negative for congestion, rhinorrhea and sore throat.   ?Eyes:  Negative for pain.  ?Respiratory:  Negative for cough and shortness of breath.   ?Gastrointestinal:  Negative for anorexia, diarrhea and vomiting.  ?Musculoskeletal:  Negative for joint pain.  ?Skin:  Positive for rash. Negative for nail changes.  ? ? ?  Objective  ?  ?BP 121/77   Pulse 77   Temp (!) 97.3 ?F (36.3 ?C) (Temporal)   Resp 16   Wt 220 lb 1.6 oz (99.8 kg)   SpO2 100%   BMI 41.59 kg/m?  ? ? ?Physical Exam ?Constitutional:   ?   General: She is not in acute distress. ?   Appearance: Normal appearance. She is obese. She is not ill-appearing, toxic-appearing or diaphoretic.  ?HENT:  ?   Head: Normocephalic and atraumatic.  ? ?   Comments: Acne and enlarged pores present. Recommend topical treatment with 1 month follow up. ?Cardiovascular:  ?   Rate and Rhythm: Normal rate.  ?Pulmonary:  ?   Effort: Pulmonary effort is normal.  ?Skin: ?   Findings: Lesion present.  ?Neurological:  ?   General: No focal deficit present.  ?  Mental Status: She is alert.  ?Psychiatric:     ?   Mood and Affect: Mood normal.     ?   Behavior: Behavior normal.     ?   Thought Content: Thought content normal.     ?   Judgment: Judgment normal.  ?  ? ?No results found for any visits on 10/04/21. ? Assessment & Plan  ?  ? ?Problem List Items Addressed This Visit   ? ?  ? Musculoskeletal and Integument  ? Acne vulgaris - Primary  ?  Acute, worsening ?Patient has got many OTC medications, creams, washes, oil absorbing sheets to assist ?Acne condones remain on R side of forehead and L eye ?Recommend change in treatment, epi duo, with use of pore reducing cream, retin to  assist ?1 month RTC ?If no improvement; will refer to derm ?  ?  ? Relevant Medications  ? tretinoin (RETIN-A) 0.025 % cream  ? Adapalene-Benzoyl Peroxide 0.1-2.5 % gel  ? ? ? ?Return in about 4 weeks (around 11/01/2021).  ?   ? ?I, Jacky Kindle, FNP, have reviewed all documentation for this visit. The documentation on 10/04/21 for the exam, diagnosis, procedures, and orders are all accurate and complete. ? ?Jacky Kindle, FNP  ?[facility] ?[phone] (phone) ?[phone] (fax) ? ?[facility] ?

## 2021-10-04 ENCOUNTER — Encounter: Payer: Self-pay | Admitting: Family Medicine

## 2021-10-04 ENCOUNTER — Ambulatory Visit (INDEPENDENT_AMBULATORY_CARE_PROVIDER_SITE_OTHER): Payer: BC Managed Care – PPO | Admitting: Family Medicine

## 2021-10-04 VITALS — BP 121/77 | HR 77 | Temp 97.3°F | Resp 16 | Wt 220.1 lb

## 2021-10-04 DIAGNOSIS — L700 Acne vulgaris: Secondary | ICD-10-CM | POA: Diagnosis not present

## 2021-10-04 MED ORDER — TRETINOIN 0.025 % EX CREA: TOPICAL_CREAM | Freq: Every day | CUTANEOUS | 3 refills | Status: DC

## 2021-10-04 MED ORDER — ADAPALENE-BENZOYL PEROXIDE 0.1-2.5 % EX GEL: 1.0000 "application " | Freq: Every day | CUTANEOUS | 3 refills | Status: DC

## 2021-10-04 NOTE — Assessment & Plan Note (Signed)
Acute, worsening ?Patient has got many OTC medications, creams, washes, oil absorbing sheets to assist ?Acne condones remain on R side of forehead and L eye ?Recommend change in treatment, epi duo, with use of pore reducing cream, retin to assist ?1 month RTC ?If no improvement; will refer to derm ?

## 2021-11-03 ENCOUNTER — Ambulatory Visit: Payer: BC Managed Care – PPO | Admitting: Family Medicine

## 2021-11-09 ENCOUNTER — Other Ambulatory Visit: Payer: Self-pay | Admitting: Family Medicine

## 2021-11-09 NOTE — Telephone Encounter (Signed)
Requested medication (s) are due for refill today: DUe 11/11/21 ? ?Requested medication (s) are on the active medication list: yes   ? ?Last refill: 08/14/21  #90  0 refills ? ?Future visit scheduled no ? ?Notes to clinic:Off protocol. Please review. Thank you. ? ?Requested Prescriptions  ?Pending Prescriptions Disp Refills  ? letrozole (FEMARA) 2.5 MG tablet [Pharmacy Med Name: LETROZOLE 2.5MG  TABLETS] 90 tablet 0  ?  Sig: TAKE 1 TABLET BY MOUTH DAILY  ?  ? Off-Protocol Failed - 11/09/2021  3:16 AM  ?  ?  Failed - Medication not assigned to a protocol, review manually.  ?  ?  Passed - Valid encounter within last 12 months  ?  Recent Outpatient Visits   ? ?      ? 1 month ago Acne vulgaris  ? [facility] Jacky Kindle, FNP  ? 5 months ago Exposure to the flu  ? [facility] Merita Norton T, FNP  ? 1 year ago Body mass index (BMI) of 45.0-49.9 in adult [facility])  ? [facility] Flinchum, [name], FNP  ? ?  ?  ? ? ?  ?  ?  ? ? ? ? ?

## 2021-11-10 ENCOUNTER — Other Ambulatory Visit: Payer: Self-pay

## 2021-11-10 ENCOUNTER — Encounter: Payer: Self-pay | Admitting: Family Medicine

## 2021-11-10 DIAGNOSIS — G43819 Other migraine, intractable, without status migrainosus: Secondary | ICD-10-CM

## 2021-11-10 MED ORDER — RIZATRIPTAN BENZOATE 5 MG PO TABS: 5.0000 mg | ORAL_TABLET | ORAL | 0 refills | Status: DC | PRN

## 2021-11-10 NOTE — Telephone Encounter (Signed)
Esmond Plants  Patient Medication Renewal Request Pool 5 hours ago (9:50 AM)  ? ?Refills have been requested for the following medications: ?  ?    rizatriptan (MAXALT) 5 MG tablet [Kimberly Harris] ?    Patient Comment: I am having a serve migraine and I have no more medication  ?  ?Preferred pharmacy: [facility] [place], [place] [facility] [place] ?Delivery method: Pickup ?

## 2021-12-26 ENCOUNTER — Telehealth: Payer: BC Managed Care – PPO | Admitting: Emergency Medicine

## 2021-12-26 DIAGNOSIS — R300 Dysuria: Secondary | ICD-10-CM

## 2021-12-26 MED ORDER — CEPHALEXIN 500 MG PO CAPS: 500.0000 mg | ORAL_CAPSULE | Freq: Two times a day (BID) | ORAL | 0 refills | Status: AC

## 2021-12-26 MED ORDER — FLUCONAZOLE 150 MG PO TABS: 150.0000 mg | ORAL_TABLET | Freq: Every day | ORAL | 0 refills | Status: DC

## 2022-02-12 ENCOUNTER — Other Ambulatory Visit: Payer: Self-pay | Admitting: Family Medicine

## 2022-02-12 DIAGNOSIS — G43819 Other migraine, intractable, without status migrainosus: Secondary | ICD-10-CM

## 2022-02-12 MED ORDER — RIZATRIPTAN BENZOATE 5 MG PO TABS: 5.0000 mg | ORAL_TABLET | ORAL | 0 refills | Status: DC | PRN

## 2022-02-13 NOTE — Progress Notes (Unsigned)
I,Sulibeya S Dimas,acting as a Neurosurgeon for Jacky Kindle, FNP.,have documented all relevant documentation on the behalf of Jacky Kindle, FNP,as directed by  Jacky Kindle, FNP while in the presence of Jacky Kindle, FNP.     Established patient visit   Patient: Nancy Ramos   DOB: February 02, 1991   30 y.o. Female  MRN: [id number] Visit Date: 02/15/2022  Today's healthcare provider: Jacky Kindle, FNP  Introduced to nurse practitioner role and practice setting.  All questions answered.  Discussed provider/patient relationship and expectations.  Chief Complaint  Patient presents with   Migraine   Subjective    Migraine  This is a recurrent problem. The current episode started in the past 7 days. The problem has been gradually improving. The pain is located in the Frontal region. The pain radiates to the face. The pain quality is similar to prior headaches. The quality of the pain is described as aching, pulsating, shooting, throbbing, stabbing, squeezing and sharp. The pain is moderate. Associated symptoms include nausea, photophobia and weakness. The symptoms are aggravated by bright light and menstrual cycle (computer screen. heat.). She has tried triptans for the symptoms. The treatment provided mild relief. Her past medical history is significant for migraine headaches.     Medications: Outpatient Medications Prior to Visit  Medication Sig   CLOMID 50 MG tablet Take by mouth.   fluticasone (FLONASE) 50 MCG/ACT nasal spray Place 1 spray into both nostrils daily. (Patient taking differently: Place 1 spray into both nostrils as needed.)   ondansetron (ZOFRAN) 4 MG tablet Take 1 tablet (4 mg total) by mouth every 8 (eight) hours as needed for nausea or vomiting.   [DISCONTINUED] rizatriptan (MAXALT) 5 MG tablet Take 1 tablet (5 mg total) by mouth as needed for migraine. May repeat in 2 hours if needed   [DISCONTINUED] Adapalene-Benzoyl Peroxide 0.1-2.5 % gel Apply 1 application.  topically at bedtime.   [DISCONTINUED] fluconazole (DIFLUCAN) 150 MG tablet Take 1 tablet (150 mg total) by mouth daily.   [DISCONTINUED] letrozole (FEMARA) 2.5 MG tablet TAKE 1 TABLET BY MOUTH DAILY   [DISCONTINUED] rizatriptan (MAXALT) 5 MG tablet Take 1 tablet (5 mg total) by mouth as needed for migraine. May repeat in 2 hours if needed   [DISCONTINUED] tretinoin (RETIN-A) 0.025 % cream Apply topically at bedtime. Start with 2 times a week, be sure to use sun protection with use. Work up to daily use.   No facility-administered medications prior to visit.    Review of Systems  Eyes:  Positive for photophobia.  Gastrointestinal:  Positive for nausea.  Neurological:  Positive for weakness.     Objective    BP 110/78 (BP Location: Right Arm, Patient Position: Sitting, Cuff Size: Large)   Pulse 79   Temp 98.4 F (36.9 C) (Oral)   Resp 16   Wt 221 lb (100.2 kg)   LMP 02/03/2022 (Exact Date)   BMI 41.76 kg/m   BP Readings from Last 3 Encounters:  02/15/22 110/78  10/04/21 121/77  08/06/21 133/87   Wt Readings from Last 3 Encounters:  02/15/22 221 lb (100.2 kg)  10/04/21 220 lb 1.6 oz (99.8 kg)  07/05/21 214 lb (97.1 kg)   Physical Exam Vitals and nursing note reviewed.  Constitutional:      General: She is not in acute distress.    Appearance: Normal appearance. She is obese. She is not ill-appearing, toxic-appearing or diaphoretic.  HENT:     Head: Normocephalic and atraumatic.  Cardiovascular:     Rate and Rhythm: Normal rate and regular rhythm.     Pulses: Normal pulses.     Heart sounds: Normal heart sounds. No murmur heard.    No friction rub. No gallop.  Pulmonary:     Effort: Pulmonary effort is normal. No respiratory distress.     Breath sounds: Normal breath sounds. No stridor. No wheezing, rhonchi or rales.  Chest:     Chest wall: No tenderness.  Musculoskeletal:        General: No swelling, tenderness, deformity or signs of injury. Normal range of motion.      Right lower leg: No edema.     Left lower leg: No edema.  Skin:    General: Skin is warm and dry.     Capillary Refill: Capillary refill takes less than 2 seconds.     Coloration: Skin is not jaundiced or pale.     Findings: No bruising, erythema, lesion or rash.  Neurological:     General: No focal deficit present.     Mental Status: She is alert and oriented to person, place, and time. Mental status is at baseline.     Cranial Nerves: No cranial nerve deficit.     Sensory: No sensory deficit.     Motor: No weakness.     Coordination: Coordination normal.  Psychiatric:        Mood and Affect: Mood normal.        Behavior: Behavior normal.        Thought Content: Thought content normal.        Judgment: Judgment normal.     No results found for any visits on 02/15/22.  Assessment & Plan     Problem List Items Addressed This Visit       Cardiovascular and Mediastinum   Chronic migraine without aura without status migrainosus, not intractable - Primary    Acute on chronic, reports current headache of 7 days Improving; however, with use of Maxalt- feels sleepy and does not resolve symptoms  Recommend trial of topamax 25-50 mg qday Encouraged referral with neurology for further control       Relevant Medications   topiramate (TOPAMAX) 25 MG tablet   rizatriptan (MAXALT) 5 MG tablet   Other Relevant Orders   Ambulatory referral to Neurology     Return in about 4 weeks (around 03/15/2022), or if symptoms worsen or fail to improve.      Leilani Merl, FNP, have reviewed all documentation for this visit. The documentation on 02/15/22 for the exam, diagnosis, procedures, and orders are all accurate and complete.  Jacky Kindle, FNP  [facility] [phone] (phone) [phone] (fax)  [facility]

## 2022-02-15 ENCOUNTER — Encounter: Payer: Self-pay | Admitting: Neurology

## 2022-02-15 ENCOUNTER — Ambulatory Visit: Payer: BC Managed Care – PPO | Admitting: Family Medicine

## 2022-02-15 ENCOUNTER — Encounter: Payer: Self-pay | Admitting: Family Medicine

## 2022-02-15 VITALS — BP 110/78 | HR 79 | Temp 98.4°F | Resp 16 | Wt 221.0 lb

## 2022-02-15 DIAGNOSIS — G43909 Migraine, unspecified, not intractable, without status migrainosus: Secondary | ICD-10-CM | POA: Insufficient documentation

## 2022-02-15 DIAGNOSIS — G43709 Chronic migraine without aura, not intractable, without status migrainosus: Secondary | ICD-10-CM

## 2022-02-15 MED ORDER — RIZATRIPTAN BENZOATE 5 MG PO TABS: 5.0000 mg | ORAL_TABLET | ORAL | 0 refills | Status: DC | PRN

## 2022-02-15 MED ORDER — TOPIRAMATE 25 MG PO TABS: ORAL_TABLET | ORAL | 0 refills | Status: DC

## 2022-02-15 NOTE — Assessment & Plan Note (Signed)
Acute on chronic, reports current headache of 7 days Improving; however, with use of Maxalt- feels sleepy and does not resolve symptoms  Recommend trial of topamax 25-50 mg qday Encouraged referral with neurology for further control

## 2022-02-28 ENCOUNTER — Other Ambulatory Visit: Payer: Self-pay | Admitting: Family Medicine

## 2022-02-28 ENCOUNTER — Encounter: Payer: Self-pay | Admitting: Family Medicine

## 2022-02-28 DIAGNOSIS — B3731 Acute candidiasis of vulva and vagina: Secondary | ICD-10-CM

## 2022-02-28 MED ORDER — FLUCONAZOLE 150 MG PO TABS
ORAL_TABLET | ORAL | 0 refills | Status: DC
Start: 2022-02-28 — End: 2022-04-26

## 2022-03-22 NOTE — Progress Notes (Signed)
NEUROLOGY CONSULTATION NOTE  Nancy Ramos MRN: [id number] DOB: 09/04/1990  Referring provider: Merita Norton, FNP Primary care provider: Merita Norton, FNP  Reason for consult:  migraines  Assessment/Plan:   Menstrual migraines, without status migrainosus, intractable  Because they have become more unpredictable and have now been triggered by other factors than her cycle, will forego perimenstrual prophylaxis and instead go ahead and start another daily preventative. Migraine prevention.  Start nortriptyline 10mg  at bedtime.  We can increase to 25mg  at bedtime in 4 weeks if needed.  Start magnesium citrate 400mg  daily.  Zofran for nausea.  Stop topiramate Migraine rescue:  Start sumatriptan 100mg .  Stop rizatriptan.   Limit use of pain relievers to no more than 2 days out of week to prevent risk of rebound or medication-overuse headache. Keep headache diary Recommended getting updated eye exam. Follow up 4 months.   Subjective:  Nancy Ramos is a 31 year old female who presents for migraines.  History supplemented by referring provider's note.  Onset:  mid-20s, when she stopped birth control (Mirena) Location:  left frontal region gradually spreads to left face, ear pain Quality:  squeezing Intensity:  severe.   Aura:  absent Prodrome:  change in mood (irritable), decreased appetite Associated symptoms:  Nausea.  She denies associated vomiting, phonophobia, visual disturbance unilateral numbness or weakness. Duration:  4 days (starting 2 days before cycle - first 2 days severe, last 2 days lingering) Frequency:  Usually starts 2 days before cycle, initially once every 3 months, since June 2023, they have been once a month. Frequency of abortive medication: 4 days a month Triggers:  menstrual cycle, maybe change in weather Relieving factors:  Vick's vapor rub to the forehead, and wears a tight hat, hydration Activity:  aggravates.  Cannot function NOTE:  September  has been more unpredictable.  Her migraine started 5 days before start of her cycle and lasted through her entire cycle.  She also had a migraine triggered by an alcoholic beverage.  Past NSAIDS/analgesics:  Excedrin Migraine, Tylenol, Aleve, Goody powder Past abortive triptans:  none Past abortive ergotamine:  none Past muscle relaxants:  tizanidine Past anti-emetic:  none Past antihypertensive medications:  none Past antidepressant medications:  none Past anticonvulsant medications:  none Past anti-CGRP:  none Past vitamins/Herbal/Supplements:  none Past antihistamines/decongestants:  none Other past therapies:  none  Current NSAIDS/analgesics:  ibuprofen Current triptans:  rizatriptan 5mg  Current ergotamine:  none Current anti-emetic:  Zofran 4mg  Current muscle relaxants:  none Current Antihypertensive medications:  none Current Antidepressant medications:  none Current Anticonvulsant medications:  topiramate 25-50mg  (makes her not feel well) Current anti-CGRP:  none Current Vitamins/Herbal/Supplements:  none Current Antihistamines/Decongestants:  Flonase Other therapy:  Vick's vapor rub, tight hat Hormone/birth control:  none   Caffeine:  1-2 large cups of coffee daily Alcohol:  2 times a month (social) Smoker:  no Diet: water throughout the day, eats healthy, sometimes skips meals (breakfast) Exercise:  tries to go to gym three times a week Depression:  ok; Anxiety:  ok Other pain:  no Sleep hygiene:  ok Family history of headache:  no      PAST MEDICAL HISTORY: Past Medical History:  Diagnosis Date   Calculus of kidney    Chlamydia    Polycystic ovaries     PAST SURGICAL HISTORY: Past Surgical History:  Procedure Laterality Date   CESAREAN SECTION     CHOLECYSTECTOMY, LAPAROSCOPIC     WISDOM TOOTH EXTRACTION  MEDICATIONS: Current Outpatient Medications on File Prior to Visit  Medication Sig Dispense Refill   CLOMID 50 MG tablet Take by mouth.      fluconazole (DIFLUCAN) 150 MG tablet Take 1 tablet, by mouth, for vaginal yeast infection; repeat dosing of 1 tablet, by mouth, if symptoms remain after 4 days. 2 tablet 0   fluticasone (FLONASE) 50 MCG/ACT nasal spray Place 1 spray into both nostrils daily. (Patient taking differently: Place 1 spray into both nostrils as needed.) 16 g 2   ondansetron (ZOFRAN) 4 MG tablet Take 1 tablet (4 mg total) by mouth every 8 (eight) hours as needed for nausea or vomiting. 15 tablet 0   rizatriptan (MAXALT) 5 MG tablet Take 1 tablet (5 mg total) by mouth as needed for migraine. May repeat in 2 hours if needed 90 tablet 0   topiramate (TOPAMAX) 25 MG tablet Take 1-2 tablets, 25-50 mg, by mouth daily for migraine prevention. Please let provider know if headache does not improve. 180 tablet 0   No current facility-administered medications on file prior to visit.    ALLERGIES: No Known Allergies  FAMILY HISTORY: Family History  Problem Relation Age of Onset   Hypertension Mother    Hypertension Father    Breast cancer Maternal Aunt        not sure of age    Objective:  Blood pressure 114/82, pulse 73, height 5\' 1"  (1.549 m), weight 222 lb 6.4 oz (100.9 kg), SpO2 97 %. General: No acute distress.  Patient appears well-groomed.   Head:  Normocephalic/atraumatic Eyes:  fundi examined but not visualized Neck: supple, no paraspinal tenderness, full range of motion Back: No paraspinal tenderness Heart: regular rate and rhythm Neurological Exam: Mental status: alert and oriented to person, place, and time, speech fluent and not dysarthric, language intact. Cranial nerves: CN I: not tested CN II: pupils equal, round and reactive to light, visual fields intact CN III, IV, VI:  full range of motion, no nystagmus, no ptosis CN V: facial sensation intact. CN VII: upper and lower face symmetric CN VIII: hearing intact CN IX, X: gag intact, uvula midline CN XI: sternocleidomastoid and trapezius muscles  intact CN XII: tongue midline Bulk & Tone: normal, no fasciculations. Motor:  muscle strength 5/5 throughout Sensation:  Pinprick, temperature and vibratory sensation intact. Deep Tendon Reflexes:  2+ throughout,  toes downgoing.   Finger to nose testing:  Without dysmetria.   Heel to shin:  Without dysmetria.   Gait:  Normal station and stride.  Romberg negative.    Thank you for allowing me to take part in the care of this patient.  Metta Clines, DO  CC: Tally Joe FNP

## 2022-03-26 ENCOUNTER — Encounter: Payer: Self-pay | Admitting: Neurology

## 2022-03-26 ENCOUNTER — Ambulatory Visit: Payer: BC Managed Care – PPO | Admitting: Neurology

## 2022-03-26 VITALS — BP 114/82 | HR 73 | Ht 61.0 in | Wt 222.4 lb

## 2022-03-26 DIAGNOSIS — G43839 Menstrual migraine, intractable, without status migrainosus: Secondary | ICD-10-CM

## 2022-03-26 NOTE — Patient Instructions (Addendum)
  Start nortriptyline 10mg  at bedtime.  Contact us in 4 weeks with update and we can increase dose if needed.  Stop topiramate Take sumatriptan 100mg  at earliest onset of headache.  May repeat dose once in 2 hours if needed.  Maximum 2 tablets in 24 hours.  Stop rizatriptan Limit use of pain relievers to no more than 2 days out of the week.  These medications include acetaminophen, NSAIDs (ibuprofen/Advil/Motrin, naproxen/Aleve, triptans (Imitrex/sumatriptan), Excedrin, and narcotics.  This will help reduce risk of rebound headaches. Be aware of common food triggers:  - Caffeine:  coffee, black tea, cola, Mt. Dew  - Chocolate  - Dairy:  aged cheeses (brie, blue, cheddar, gouda, Sesser, provolone, [place], Swiss, etc), chocolate milk, buttermilk, sour cream, limit eggs and yogurt  - Nuts, peanut butter  - Alcohol  - Cereals/grains:  FRESH breads (fresh bagels, sourdough, doughnuts), yeast productions  - Processed/canned/aged/cured meats (pre-packaged deli meats, hotdogs)  - MSG/glutamate:  soy sauce, flavor enhancer, pickled/preserved/marinated foods  - Sweeteners:  aspartame (Equal, Nutrasweet).  Sugar and Splenda are okay  - Vegetables:  legumes (lima beans, lentils, snow peas, fava beans, pinto peans, peas, garbanzo beans), sauerkraut, onions, olives, pickles  - Fruit:  avocados, bananas, citrus fruit (orange, lemon, grapefruit), mango  - Other:  Frozen meals, macaroni and cheese Routine exercise Stay adequately hydrated (aim for 64 oz water daily) Keep headache diary Maintain proper stress management Maintain proper sleep hygiene Do not skip meals Consider supplements:  magnesium citrate 400mg  daily, riboflavin 400mg  daily, coenzyme Q10 100mg  three times daily. Follow up 4 months.

## 2022-04-09 ENCOUNTER — Other Ambulatory Visit: Payer: Self-pay

## 2022-04-09 ENCOUNTER — Encounter: Payer: Self-pay | Admitting: Neurology

## 2022-04-09 MED ORDER — NORTRIPTYLINE HCL 10 MG PO CAPS: 10.0000 mg | ORAL_CAPSULE | Freq: Every day | ORAL | 5 refills | Status: DC

## 2022-04-09 MED ORDER — SUMATRIPTAN SUCCINATE 100 MG PO TABS: 100.0000 mg | ORAL_TABLET | Freq: Once | ORAL | 5 refills | Status: DC | PRN

## 2022-04-20 NOTE — Progress Notes (Deleted)
      Established patient visit   Patient: Nancy Ramos   DOB: 02-Jun-1991   31 y.o. Female  MRN: [id number] Visit Date: 04/27/2022  Today's healthcare provider: Gwyneth Sprout, FNP   No chief complaint on file.  Subjective    HPI  ***  Medications: Outpatient Medications Prior to Visit  Medication Sig   CLOMID 50 MG tablet Take by mouth.   fluconazole (DIFLUCAN) 150 MG tablet Take 1 tablet, by mouth, for vaginal yeast infection; repeat dosing of 1 tablet, by mouth, if symptoms remain after 4 days.   fluticasone (FLONASE) 50 MCG/ACT nasal spray Place 1 spray into both nostrils daily. (Patient taking differently: Place 1 spray into both nostrils as needed.)   nortriptyline (PAMELOR) 10 MG capsule Take 1 capsule (10 mg total) by mouth at bedtime.   ondansetron (ZOFRAN) 4 MG tablet Take 1 tablet (4 mg total) by mouth every 8 (eight) hours as needed for nausea or vomiting.   SUMAtriptan (IMITREX) 100 MG tablet Take 1 tablet (100 mg total) by mouth once as needed for up to 1 dose for migraine. May repeat in 2 hours if headache persists or recurs.   No facility-administered medications prior to visit.    Review of Systems  {Labs  Heme  Chem  Endocrine  Serology  Results Review (optional):23779}   Objective    There were no vitals taken for this visit. {Show previous vital signs (optional):23777}  Physical Exam  ***  No results found for any visits on 04/27/22.  Assessment & Plan     ***  No follow-ups on file.      {provider attestation***:1}   Gwyneth Sprout, [place] [phone] (phone) [phone] (fax)  [place]

## 2022-04-24 NOTE — Progress Notes (Unsigned)
  Established patient visit  Patient: Nancy Ramos   DOB: 10-Feb-1991   31 y.o. Female  MRN: [id number] Visit Date: 04/26/2022  Today's healthcare provider: Gwyneth Sprout, FNP  Re Introduced to nurse practitioner role and practice setting.  All questions answered.  Discussed provider/patient relationship and expectations.  I,Marsean Elkhatib J Rhapsody Wolven,acting as a scribe for Gwyneth Sprout, FNP.,have documented all relevant documentation on the behalf of Gwyneth Sprout, FNP,as directed by  Gwyneth Sprout, FNP while in the presence of Gwyneth Sprout, FNP.  Chief Complaint  Patient presents with   Vaginal Itching    Patient complains of vaginal itching, burning starting a week ago. States she wants STI tests.   Subjective    HPI HPI     Vaginal Itching    Additional comments: Patient complains of vaginal itching, burning starting a week ago. States she wants STI tests.      Last edited by Smitty Knudsen, CMA on 04/26/2022 10:53 AM.      Medications: Outpatient Medications Prior to Visit  Medication Sig   CLOMID 50 MG tablet Take by mouth.   fluticasone (FLONASE) 50 MCG/ACT nasal spray Place 1 spray into both nostrils daily. (Patient taking differently: Place 1 spray into both nostrils as needed.)   nortriptyline (PAMELOR) 10 MG capsule Take 1 capsule (10 mg total) by mouth at bedtime.   ondansetron (ZOFRAN) 4 MG tablet Take 1 tablet (4 mg total) by mouth every 8 (eight) hours as needed for nausea or vomiting.   SUMAtriptan (IMITREX) 100 MG tablet Take 1 tablet (100 mg total) by mouth once as needed for up to 1 dose for migraine. May repeat in 2 hours if headache persists or recurs.   [DISCONTINUED] fluconazole (DIFLUCAN) 150 MG tablet Take 1 tablet, by mouth, for vaginal yeast infection; repeat dosing of 1 tablet, by mouth, if symptoms remain after 4 days.   No facility-administered medications prior to visit.    Review of Systems    Objective    BP (!) 118/90 (BP Location: Right  Arm, Patient Position: Sitting, Cuff Size: Large)   Pulse 86   Resp 16   Ht 5\' 1"  (1.549 m)   Wt 223 lb (101.2 kg)   SpO2 100%   BMI 42.14 kg/m   Physical Exam   No results found for any visits on 04/26/22.  Assessment & Plan     Problem List Items Addressed This Visit       Other   Routine screening for STI (sexually transmitted infection)    Request for STI testing given recurrent vaginal yeast infections       Relevant Orders   HIV antibody (with reflex)   Hepatitis C Antibody   Cervicovaginal ancillary only   Yeast infection - Primary    Recurrent; symptoms for the last 7 days External vaginal irritation-recommend topicals Recommend repeat dose of diflucan       Relevant Medications   fluconazole (DIFLUCAN) 150 MG tablet   nystatin-triamcinolone ointment (MYCOLOG)   Return if symptoms worsen or fail to improve.     Vonna Kotyk, FNP, have reviewed all documentation for this visit. The documentation on 04/26/22 for the exam, diagnosis, procedures, and orders are all accurate and complete.  Gwyneth Sprout, [place] [phone] (phone) [phone] (fax)  [place]

## 2022-04-26 ENCOUNTER — Ambulatory Visit: Payer: BC Managed Care – PPO | Admitting: Family Medicine

## 2022-04-26 ENCOUNTER — Ambulatory Visit: Payer: BC Managed Care – PPO | Admitting: Neurology

## 2022-04-26 ENCOUNTER — Encounter: Payer: Self-pay | Admitting: Family Medicine

## 2022-04-26 ENCOUNTER — Other Ambulatory Visit (HOSPITAL_COMMUNITY)
Admission: RE | Admit: 2022-04-26 | Discharge: 2022-04-26 | Disposition: A | Discharge status: Home / Self Care | Payer: BC Managed Care – PPO | Source: Ambulatory Visit | Attending: Family Medicine | Admitting: Family Medicine

## 2022-04-26 VITALS — BP 118/90 | HR 86 | Resp 16 | Ht 61.0 in | Wt 223.0 lb

## 2022-04-26 DIAGNOSIS — Z113 Encounter for screening for infections with a predominantly sexual mode of transmission: Secondary | ICD-10-CM | POA: Insufficient documentation

## 2022-04-26 DIAGNOSIS — B379 Candidiasis, unspecified: Secondary | ICD-10-CM | POA: Diagnosis not present

## 2022-04-26 MED ORDER — FLUCONAZOLE 150 MG PO TABS: ORAL_TABLET | ORAL | 0 refills | Status: DC

## 2022-04-26 MED ORDER — NYSTATIN-TRIAMCINOLONE 100000-0.1 UNIT/GM-% EX OINT: 1.0000 | TOPICAL_OINTMENT | Freq: Two times a day (BID) | CUTANEOUS | 0 refills | Status: DC

## 2022-04-26 NOTE — Assessment & Plan Note (Signed)
Request for STI testing given recurrent vaginal yeast infections

## 2022-04-26 NOTE — Assessment & Plan Note (Signed)
Recurrent; symptoms for the last 7 days External vaginal irritation-recommend topicals Recommend repeat dose of diflucan

## 2022-04-27 ENCOUNTER — Ambulatory Visit: Payer: BC Managed Care – PPO | Admitting: Family Medicine

## 2022-04-27 LAB — HEPATITIS C ANTIBODY: Hep C Virus Ab: NONREACTIVE

## 2022-04-27 LAB — CERVICOVAGINAL ANCILLARY ONLY
Bacterial Vaginitis (gardnerella): NEGATIVE
Candida Glabrata: NEGATIVE
Candida Vaginitis: POSITIVE — AB
Chlamydia: NEGATIVE
Comment: NEGATIVE
Comment: NEGATIVE
Comment: NEGATIVE
Comment: NEGATIVE
Comment: NEGATIVE
Comment: NORMAL
Neisseria Gonorrhea: NEGATIVE
Trichomonas: NEGATIVE

## 2022-04-27 LAB — HIV ANTIBODY (ROUTINE TESTING W REFLEX): HIV Screen 4th Generation wRfx: NONREACTIVE

## 2022-04-27 NOTE — Progress Notes (Signed)
Blood work negative.  Swab pending.

## 2022-04-27 NOTE — Progress Notes (Signed)
Positive for yeast. Continue with diflucan medications.

## 2022-05-11 ENCOUNTER — Encounter: Payer: Self-pay | Admitting: Neurology

## 2022-05-16 NOTE — Progress Notes (Deleted)
      Established patient visit   Patient: Nancy Ramos   DOB: 1990-12-07   31 y.o. Female  MRN: [id number] Visit Date: 05/17/2022  Today's healthcare provider: Jacky Kindle, FNP   No chief complaint on file.  Subjective    HPI  ***  Medications: Outpatient Medications Prior to Visit  Medication Sig  . CLOMID 50 MG tablet Take by mouth.  . fluconazole (DIFLUCAN) 150 MG tablet Take 1 tablet PO and repeat in 4 days  . fluticasone (FLONASE) 50 MCG/ACT nasal spray Place 1 spray into both nostrils daily. (Patient taking differently: Place 1 spray into both nostrils as needed.)  . nortriptyline (PAMELOR) 10 MG capsule Take 1 capsule (10 mg total) by mouth at bedtime.  Marland Kitchen nystatin-triamcinolone ointment (MYCOLOG) Apply 1 Application topically 2 (two) times daily.  . ondansetron (ZOFRAN) 4 MG tablet Take 1 tablet (4 mg total) by mouth every 8 (eight) hours as needed for nausea or vomiting.  . SUMAtriptan (IMITREX) 100 MG tablet Take 1 tablet (100 mg total) by mouth once as needed for up to 1 dose for migraine. May repeat in 2 hours if headache persists or recurs.   No facility-administered medications prior to visit.    Review of Systems  Constitutional:  Negative for appetite change, chills, fatigue and fever.  Respiratory:  Negative for chest tightness and shortness of breath.   Cardiovascular:  Negative for chest pain and palpitations.  Gastrointestinal:  Negative for abdominal pain, nausea and vomiting.  Neurological:  Negative for dizziness and weakness.   {Labs  Heme  Chem  Endocrine  Serology  Results Review (optional):23779}   Objective    There were no vitals taken for this visit. {Show previous vital signs (optional):23777}  Physical Exam  ***  No results found for any visits on 05/17/22.  Assessment & Plan     ***  No follow-ups on file.      {provider attestation***:1}   Jacky Kindle, FNP  [facility] [phone]  (phone) [phone] (fax)  [facility]

## 2022-05-17 ENCOUNTER — Other Ambulatory Visit: Payer: Self-pay | Admitting: Family Medicine

## 2022-05-17 ENCOUNTER — Ambulatory Visit: Payer: BC Managed Care – PPO | Admitting: Family Medicine

## 2022-05-17 DIAGNOSIS — G43709 Chronic migraine without aura, not intractable, without status migrainosus: Secondary | ICD-10-CM

## 2022-05-17 NOTE — Telephone Encounter (Signed)
D/c'd 03/26/2022 not on med list. Requested Prescriptions  Pending Prescriptions Disp Refills   topiramate (TOPAMAX) 25 MG tablet [Pharmacy Med Name: TOPIRAMATE 25MG  TABLETS] 180 tablet 0    Sig: TAKE 1 TO 2 TABLETS BY MOUTH DAILY FOR MIGRAIN PREVENTION. PLEASE LET PROVIDER KNOW IF HEADACHE DOES NOT IMPROVE     Neurology: Anticonvulsants - topiramate & zonisamide Failed - 05/17/2022  3:16 AM      Failed - Cr in normal range and within 360 days    Creatinine  Date Value Ref Range Status  03/07/2012 0.66 0.60 - 1.30 mg/dL Final         Failed - CO2 in normal range and within 360 days    Co2  Date Value Ref Range Status  03/07/2012 24 21 - 32 mmol/L Final         Failed - ALT in normal range and within 360 days    SGPT (ALT)  Date Value Ref Range Status  03/07/2012 27 12 - 78 U/L Final         Failed - AST in normal range and within 360 days    SGOT(AST)  Date Value Ref Range Status  03/07/2012 29 (H) 0 - 26 Unit/L Final         Passed - Completed PHQ-2 or PHQ-9 in the last 360 days      Passed - Valid encounter within last 12 months    Recent Outpatient Visits           3 weeks ago Yeast infection   [facility], FNP   3 months ago Chronic migraine without aura without status migrainosus, not intractable   [facility], FNP   7 months ago Acne vulgaris   [facility] T, FNP   11 months ago Exposure to the flu   [facility] T, FNP   1 year ago Body mass index (BMI) of 45.0-49.9 in adult [facility])   [facility] [place], [facility], FNP

## 2022-05-18 ENCOUNTER — Emergency Department
Admission: EM | Admit: 2022-05-18 | Discharge: 2022-05-18 | Disposition: A | Discharge status: Home / Self Care | Payer: BC Managed Care – PPO | Attending: Emergency Medicine | Admitting: Emergency Medicine

## 2022-05-18 ENCOUNTER — Emergency Department: Payer: BC Managed Care – PPO

## 2022-05-18 ENCOUNTER — Other Ambulatory Visit: Payer: Self-pay

## 2022-05-18 ENCOUNTER — Encounter: Payer: Self-pay | Admitting: Emergency Medicine

## 2022-05-18 DIAGNOSIS — K5900 Constipation, unspecified: Secondary | ICD-10-CM | POA: Insufficient documentation

## 2022-05-18 DIAGNOSIS — R109 Unspecified abdominal pain: Secondary | ICD-10-CM

## 2022-05-18 DIAGNOSIS — R1031 Right lower quadrant pain: Secondary | ICD-10-CM | POA: Insufficient documentation

## 2022-05-18 LAB — POC URINE PREG, ED
Preg Test, Ur: NEGATIVE
Preg Test, Ur: NEGATIVE

## 2022-05-18 LAB — LIPASE, BLOOD: Lipase: 29 U/L (ref 11–51)

## 2022-05-18 LAB — CBC WITH DIFFERENTIAL/PLATELET
Abs Immature Granulocytes: 0.03 10*3/uL (ref 0.00–0.07)
Basophils Absolute: 0 10*3/uL (ref 0.0–0.1)
Basophils Relative: 1 %
Eosinophils Absolute: 0.1 10*3/uL (ref 0.0–0.5)
Eosinophils Relative: 1 %
HCT: 39.8 % (ref 36.0–46.0)
Hemoglobin: 13.1 g/dL (ref 12.0–15.0)
Immature Granulocytes: 0 %
Lymphocytes Relative: 36 %
Lymphs Abs: 2.9 10*3/uL (ref 0.7–4.0)
MCH: 28.1 pg (ref 26.0–34.0)
MCHC: 32.9 g/dL (ref 30.0–36.0)
MCV: 85.2 fL (ref 80.0–100.0)
Monocytes Absolute: 0.6 10*3/uL (ref 0.1–1.0)
Monocytes Relative: 7 %
Neutro Abs: 4.4 10*3/uL (ref 1.7–7.7)
Neutrophils Relative %: 55 %
Platelets: 257 10*3/uL (ref 150–400)
RBC: 4.67 MIL/uL (ref 3.87–5.11)
RDW: 13 % (ref 11.5–15.5)
WBC: 8.1 10*3/uL (ref 4.0–10.5)
nRBC: 0 % (ref 0.0–0.2)

## 2022-05-18 LAB — COMPREHENSIVE METABOLIC PANEL
ALT: 13 U/L (ref 0–44)
AST: 22 U/L (ref 15–41)
Albumin: 4.2 g/dL (ref 3.5–5.0)
Alkaline Phosphatase: 59 U/L (ref 38–126)
Anion gap: 8 (ref 5–15)
BUN: 11 mg/dL (ref 6–20)
CO2: 22 mmol/L (ref 22–32)
Calcium: 9 mg/dL (ref 8.9–10.3)
Chloride: 110 mmol/L (ref 98–111)
Creatinine, Ser: 0.74 mg/dL (ref 0.44–1.00)
GFR, Estimated: >60 mL/min (ref >60)
Glucose, Bld: 103 mg/dL — ABNORMAL HIGH (ref 70–99)
Potassium: 3.9 mmol/L (ref 3.5–5.1)
Sodium: 140 mmol/L (ref 135–145)
Total Bilirubin: 0.7 mg/dL (ref 0.3–1.2)
Total Protein: 7.6 g/dL (ref 6.5–8.1)

## 2022-05-18 LAB — URINALYSIS, ROUTINE W REFLEX MICROSCOPIC
Bacteria, UA: NONE SEEN
Bilirubin Urine: NEGATIVE
Glucose, UA: NEGATIVE mg/dL
Ketones, ur: NEGATIVE mg/dL
Nitrite: NEGATIVE
Protein, ur: NEGATIVE mg/dL
Specific Gravity, Urine: 1.01 (ref 1.005–1.030)
pH: 6 (ref 5.0–8.0)

## 2022-05-18 MED ORDER — LACTULOSE 10 GM/15ML PO SOLN: 20.0000 g | Freq: Every day | ORAL | 0 refills | Status: DC | PRN

## 2022-05-18 MED ORDER — SODIUM CHLORIDE 0.9 % IV BOLUS
1000.0000 mL | Freq: Once | INTRAVENOUS | Status: AC
  Administered 2022-05-18: 1000 mL via INTRAVENOUS

## 2022-05-18 MED ORDER — ONDANSETRON HCL 4 MG/2ML IJ SOLN
4.0000 mg | Freq: Once | INTRAMUSCULAR | Status: AC
  Administered 2022-05-18: 4 mg via INTRAVENOUS
  Filled 2022-05-18: qty 2

## 2022-05-18 MED ORDER — HYDROMORPHONE HCL 1 MG/ML IJ SOLN
0.5000 mg | Freq: Once | INTRAMUSCULAR | Status: AC
  Administered 2022-05-18: 0.5 mg via INTRAVENOUS
  Filled 2022-05-18: qty 0.5

## 2022-05-18 MED ORDER — HYDROCODONE-ACETAMINOPHEN 5-325 MG PO TABS: 1.0000 | ORAL_TABLET | Freq: Four times a day (QID) | ORAL | 0 refills | Status: DC | PRN

## 2022-05-18 NOTE — ED Triage Notes (Signed)
Patient ambulatory to triage with steady gait, without difficulty or distress noted; pt c/o rt flank pain radiating into rt abd x wk accomp by nausea/vomiting; seen at urgent care and had xray with UA that were normal

## 2022-05-18 NOTE — ED Provider Notes (Signed)
[facility] Provider Note    Event Date/Time   First MD Initiated Contact with Patient 05/18/22 0405     (approximate)   History   Flank Pain   HPI  Nancy Ramos is a 31 y.o. female who presents to the ED from home with a chief complaint of right flank and abdominal pain.  Symptoms x1 week associated with occasional nausea and vomiting.  Seen at urgent care 05/16/2022 with UA and KUB.  Diagnosed with UTI and constipation.  Placed on cefdinir, MiraLAX, Colace and Diflucan.  Reports medical compliance but worsening pain.  Denies fever, chills, cough, chest pain, shortness of breath, hematuria or diarrhea.     Past Medical History   Past Medical History:  Diagnosis Date   Calculus of kidney    Chlamydia    Polycystic ovaries      Active Problem List   Patient Active Problem List   Diagnosis Date Noted   Yeast infection 04/26/2022   Routine screening for STI (sexually transmitted infection) 04/26/2022   Chronic migraine without aura without status migrainosus, not intractable 02/15/2022   Menstruation, irregular 05/30/2021   Vitamin D deficiency 06/07/2020     Past Surgical History   Past Surgical History:  Procedure Laterality Date   CESAREAN SECTION     CHOLECYSTECTOMY, LAPAROSCOPIC     WISDOM TOOTH EXTRACTION       Home Medications   Prior to Admission medications   Medication Sig Start Date End Date Taking? Authorizing Provider  CLOMID 50 MG tablet Take by mouth. 01/08/22   [provider]  fluconazole (DIFLUCAN) 150 MG tablet Take 1 tablet PO and repeat in 4 days 04/26/22   Jacky Kindle, FNP  fluticasone [facility]) 50 MCG/ACT nasal spray Place 1 spray into both nostrils daily. Patient taking differently: Place 1 spray into both nostrils as needed. 08/04/21   Coralyn Mark, NP  nortriptyline (PAMELOR) 10 MG capsule Take 1 capsule (10 mg total) by mouth at bedtime. 04/09/22   Drema Dallas, DO   nystatin-triamcinolone ointment (MYCOLOG) Apply 1 Application topically 2 (two) times daily. 04/26/22   Jacky Kindle, FNP  ondansetron (ZOFRAN) 4 MG tablet Take 1 tablet (4 mg total) by mouth every 8 (eight) hours as needed for nausea or vomiting. 05/30/21   Jacky Kindle, FNP  SUMAtriptan (IMITREX) 100 MG tablet Take 1 tablet (100 mg total) by mouth once as needed for up to 1 dose for migraine. May repeat in 2 hours if headache persists or recurs. 04/09/22   Drema Dallas, DO     Allergies  Patient has no known allergies.   Family History   Family History  Problem Relation Age of Onset   Hypertension Mother    Hypertension Father    Breast cancer Maternal Aunt        not sure of age     Physical Exam  Triage Vital Signs: ED Triage Vitals  Enc Vitals Group     BP 05/18/22 0358 (!) 124/96     Pulse Rate 05/18/22 0358 81     Resp 05/18/22 0358 16     Temp 05/18/22 0358 98.2 F (36.8 C)     Temp Source 05/18/22 0358 Oral     SpO2 05/18/22 0358 100 %     Weight 05/18/22 0354 220 lb (99.8 kg)     Height 05/18/22 0354 5\' 1"  (1.549 m)     Head Circumference --  Peak Flow --      Pain Score 05/18/22 0354 10     Pain Loc --      Pain Edu? --      Excl. in GC? --     Updated Vital Signs: BP 110/84 (BP Location: Right Arm)   Pulse 65   Temp 98.2 F (36.8 C) (Oral)   Resp 16   Ht 5\' 1"  (1.549 m)   Wt 99.8 kg   LMP 05/06/2022 (Exact Date)   SpO2 99%   BMI 41.57 kg/m    General: Awake, mild distress.  CV:  RRR.  Good peripheral perfusion.  Resp:  Normal effort.  CTA B. Abd:  Mild right lateral tenderness to palpation without rebound or guarding.  Mild right CVAT.  No distention.  Other:  No truncal vesicles.   ED Results / Procedures / Treatments  Labs (all labs ordered are listed, but only abnormal results are displayed) Labs Reviewed  COMPREHENSIVE METABOLIC PANEL - Abnormal; Notable for the following components:      Result Value   Glucose, Bld 103  (*)    All other components within normal limits  URINALYSIS, ROUTINE W REFLEX MICROSCOPIC - Abnormal; Notable for the following components:   Color, Urine YELLOW (*)    APPearance HAZY (*)    Hgb urine dipstick SMALL (*)    Leukocytes,Ua TRACE (*)    All other components within normal limits  CBC WITH DIFFERENTIAL/PLATELET  LIPASE, BLOOD  POC URINE PREG, ED  POC URINE PREG, ED     EKG  None   RADIOLOGY I have independently visualized and interpreted patient's CT scan as well as noted the radiology interpretation:  CT renal colic study.  No acute findings, normal appendix  Official radiology report(s): CT Renal Stone Study  Result Date: 05/18/2022 CLINICAL DATA:  31 year old female with history of flank pain. Suspected kidney stone. EXAM: CT ABDOMEN AND PELVIS WITHOUT CONTRAST TECHNIQUE: Multidetector CT imaging of the abdomen and pelvis was performed following the standard protocol without IV contrast. RADIATION DOSE REDUCTION: This exam was performed according to the departmental dose-optimization program which includes automated exposure control, adjustment of the mA and/or kV according to patient size and/or use of iterative reconstruction technique. COMPARISON:  CT of the abdomen and pelvis 05/01/2010. FINDINGS: Lower chest: Unremarkable. Hepatobiliary: No definite suspicious cystic or solid hepatic lesions are confidently identified on today's noncontrast CT examination. Status post cholecystectomy. Pancreas: No definite pancreatic mass or peripancreatic fluid collections or inflammatory changes are noted on today's noncontrast CT examination. Spleen: Unremarkable. Adrenals/Urinary Tract: There are no abnormal calcifications within the collecting system of either kidney, along the course of either ureter, or within the lumen of the urinary bladder. No hydroureteronephrosis or perinephric stranding to suggest urinary tract obstruction at this time. The unenhanced appearance of the  kidneys is unremarkable bilaterally. Unenhanced appearance of the urinary bladder is normal. Right adrenal gland is normal in appearance. In the medial limb of the left adrenal gland there is a 1.5 x 1.0 cm low-attenuation (8 HU) left adrenal nodule, compatible with a benign adenoma (no imaging follow-up recommended). Stomach/Bowel: The appearance of the stomach is normal. There is no pathologic dilatation of small bowel or colon. Normal appendix. Vascular/Lymphatic: No atherosclerotic calcifications are noted in the abdominal aorta or pelvic vasculature. No lymphadenopathy noted in the abdomen or pelvis. Reproductive: Uterus and ovaries are unremarkable in appearance. Other: No significant volume of ascites.  No pneumoperitoneum. Musculoskeletal: There are no aggressive appearing lytic  or blastic lesions noted in the visualized portions of the skeleton. IMPRESSION: 1. No acute findings are noted in the abdomen or pelvis to account for the patient's symptoms. Specifically, no urinary tract calculi no findings of urinary tract obstruction at this time. 2. Small left adrenal adenoma incidentally noted. Electronically Signed   By: Trudie Reed M.D.   On: 05/18/2022 05:15     PROCEDURES:  Critical Care performed: No  .1-3 Lead EKG Interpretation  Performed by: Irean Hong, MD Authorized by: Irean Hong, MD     Interpretation: normal     ECG rate:  80   ECG rate assessment: normal     Rhythm: sinus rhythm     Ectopy: none     Conduction: normal   Comments:     Patient placed on cardiac monitor to evaluate for arrhythmias    MEDICATIONS ORDERED IN ED: Medications  sodium chloride 0.9 % bolus 1,000 mL (1,000 mLs Intravenous New Bag/Given 05/18/22 0426)  ondansetron (ZOFRAN) injection 4 mg (4 mg Intravenous Given 05/18/22 0425)  HYDROmorphone (DILAUDID) injection 0.5 mg (0.5 mg Intravenous Given 05/18/22 0426)     IMPRESSION / MDM / ASSESSMENT AND PLAN / ED COURSE  I reviewed the triage  vital signs and the nursing notes.                             31 year old female presenting with right flank to abdominal pain. Differential diagnosis includes, but is not limited to, ovarian cyst, ovarian torsion, acute appendicitis, diverticulitis, urinary tract infection/pyelonephritis, endometriosis, bowel obstruction, colitis, renal colic, gastroenteritis, hernia, fibroids, endometriosis, pregnancy related pain including ectopic pregnancy, etc. have personally reviewed patient's records from 05/16/2022's office visit and note her UA and KUB.  Patient's presentation is most consistent with acute presentation with potential threat to life or bodily function.  The patient is on the cardiac monitor to evaluate for evidence of arrhythmia and/or significant heart rate changes.  We will obtain basic lab work, POCT.  Initiate IV fluid resuscitation, IV Dilaudid for pain paired with IV Zofran for nausea.  Will obtain CT renal colic study and reassess.  Clinical Course as of 05/18/22 0543  Fri May 18, 2022  0542 Pain improved after IV Dilaudid.  Updated patient and family member of test results demonstrating normal WBC 8.1, normal electrolytes/kidney function, trace leukocytes in urine.  CT unremarkable, moderate stool burden noted.  Will add lactulose and Norco to patient's current medication regimen consisting of cefdinir, Colace and Diflucan as needed.  Strict return precautions given.  Both verbalized understanding and agreed with plan of care. [JS]    Clinical Course User Index [JS] Irean Hong, MD     FINAL CLINICAL IMPRESSION(S) / ED DIAGNOSES   Final diagnoses:  Right flank pain  Right lower quadrant abdominal pain  Constipation, unspecified constipation type     Rx / DC Orders   ED Discharge Orders     None        Note:  This document was prepared using Dragon voice recognition software and may include unintentional dictation errors.   Irean Hong, MD 05/18/22  [phone]

## 2022-05-18 NOTE — ED Notes (Signed)
PT A&Ox4. Pt endorses flank pain for 1 week with worsening pain. Pt had been dx with a UTI at a prior DR's visit and is on medication for said UTI. Pt has HX of kidney stones. Pt is ambulatory.

## 2022-05-18 NOTE — Discharge Instructions (Signed)
Continue current medications as previously prescribed.  Take Lactulose as needed for bowel movements.  You may take Norco as needed for more severe pain.  Plenty of fluids daily.  Return to the ER for worsening symptoms, persistent vomiting, difficulty breathing or other concerns.

## 2022-05-21 ENCOUNTER — Encounter: Payer: Self-pay | Admitting: Family Medicine

## 2022-05-21 ENCOUNTER — Ambulatory Visit: Payer: BC Managed Care – PPO | Admitting: Family Medicine

## 2022-05-21 VITALS — BP 118/89 | HR 74 | Temp 98.3°F | Resp 16 | Ht 61.0 in | Wt 225.0 lb

## 2022-05-21 DIAGNOSIS — R1031 Right lower quadrant pain: Secondary | ICD-10-CM

## 2022-05-21 DIAGNOSIS — R3129 Other microscopic hematuria: Secondary | ICD-10-CM | POA: Diagnosis not present

## 2022-05-21 LAB — POCT URINALYSIS DIPSTICK
Bilirubin, UA: NEGATIVE
Blood, UA: POSITIVE
Glucose, UA: NEGATIVE
Ketones, UA: NEGATIVE
Leukocytes, UA: NEGATIVE
Nitrite, UA: NEGATIVE
Protein, UA: NEGATIVE
Spec Grav, UA: 1.015 (ref 1.010–1.025)
Urobilinogen, UA: 0.2 E.U./dL
pH, UA: 6 (ref 5.0–8.0)

## 2022-05-21 LAB — POCT URINE PREGNANCY: Preg Test, Ur: NEGATIVE

## 2022-05-21 NOTE — Assessment & Plan Note (Signed)
Intermittently worsening right lower quadrant abdominal pain She is not in distress at this time and has normal abdominal exam with soft abdomen and no Murphy sign, no Rovsing sign, no McBurney's point sign Recommended that patient continue to hydrate orally We will recheck urine pregnancy test, low suspicion for pregnancy as patient recently finished menses over the weekend however she notes some abnormalities so we will retest We will also recheck urine analysis and collect urine culture Recommended that she stop cefdinir as she may be experiencing influx of changes to the gastric mucosa causing her symptoms Also recommended that she stop the lactulose that she is having loose stools We talked about tier treatment of constipation starting with MiraLAX if she is beginning to show signs of constipation follow-up by senna and then lactulose if constipation persist for several days Recommended over-the-counter Gas-X or Maalox for symptoms as well as slowly progressing her diet as I think that she reintroduce fatty foods and alcohol too quickly after recovering from a likely gastroenteritis illness She should follow-up as needed Reviewed ED precautions including inability to tolerate any oral fluids or the development of bloody stools or vomit

## 2022-05-21 NOTE — Patient Instructions (Addendum)
You were evaluated today for abdominal pain.  I recommend that she stop taking the cefdinir antibiotic.  Please stop taking the lactulose to help with bowel movements.  Today we will retest for pregnancy as well as any signs of a urinary tract infection.  We will notify you of any abnormal results once these are available.  Please follow the brat diet as listed in detail below.   You will need to be evaluated in the ED emergently if you develop bloody stools, bloody vomit or if you are unable to take any fluids by mouth.    Nausea, Adult Nausea is feeling like you may vomit. Feeling like you may vomit is usually not serious, but it may be an early sign of a more serious medical problem. Vomiting is when stomach contents forcefully come out of your mouth. If you vomit, or if you are not able to drink enough fluids, you may not have enough water in your body (get dehydrated). If you do not have enough water in your body, you may: Feel tired. Feel thirsty. Have a dry mouth. Have cracked lips. Pee (urinate) less often. Older adults and people who have other diseases or a weak body defense system (immune system) have a higher risk of not having enough water in the body. The main goals of treating this condition are: To relieve your nausea. To ensure your nausea occurs less often. To prevent vomiting and losing too much fluid. Follow these instructions at home: Watch your symptoms for any changes. Tell your doctor about them. Eating and drinking     Take an ORS (oral rehydration solution). This is a drink that is sold at pharmacies and stores. Drink clear fluids in small amounts as you are able. These include: Water. Ice chips. Fruit juice that has water added (diluted fruit juice). Low-calorie sports drinks. Eat bland, easy-to-digest foods in small amounts as you are able, such as: Bananas. Applesauce. Rice. Low-fat (lean) meats. Toast. Crackers. Avoid drinking fluids that  have a lot of sugar or caffeine in them. This includes energy drinks, sports drinks, and soda. Avoid alcohol. Avoid spicy or fatty foods. General instructions Take over-the-counter and prescription medicines only as told by your doctor. Rest at home while you get better. Drink enough fluid to keep your pee (urine) pale yellow. Take slow and deep breaths when you feel like you may vomit. Avoid food or things that have strong smells. Wash your hands often with soap and water for at least 20 seconds. If you cannot use soap and water, use hand sanitizer. Make sure that everyone in your home washes their hands well and often. Keep all follow-up visits. Contact a doctor if: You feel worse. You feel like you may vomit and this lasts for more than 2 days. You vomit. You are not able to drink fluids without vomiting. You have new symptoms. You have a fever. You have a headache. You have muscle cramps. You have a rash. You have pain while peeing. You feel light-headed or dizzy. Get help right away if: You have pain in your chest, neck, arm, or jaw. You feel very weak or you faint. You have vomit that is bright red or looks like coffee grounds. You have bloody or black poop (stools) or poop that looks like tar. You have a very bad headache, a stiff neck, or both. You have very bad pain, cramping, or bloating in your belly (abdomen). You have trouble breathing or you are breathing very quickly. Your  heart is beating very quickly. Your skin feels cold and clammy. You feel confused. You have signs of losing too much water in your body, such as: Dark pee, very little pee, or no pee. Cracked lips. Dry mouth. Sunken eyes. Sleepiness. Weakness. These symptoms may be an emergency. Get help right away. Call 911. Do not wait to see if the symptoms will go away. Do not drive yourself to the hospital. Summary Nausea is feeling like you are about vomit. If you vomit, or if you are not able to  drink enough fluids, you may not have enough water in your body (get dehydrated). Eat and drink what your doctor tells you. Take over-the-counter and prescription medicines only as told by your doctor. Contact a doctor right away if your symptoms get worse or you have new symptoms. Keep all follow-up visits. This information is not intended to replace advice given to you by your health care provider. Make sure you discuss any questions you have with your health care provider. Document Revised: 12/23/2020 Document Reviewed: 12/23/2020 Elsevier Patient Education  2023 [employer].

## 2022-05-21 NOTE — Progress Notes (Signed)
I,Tiffany J Bragg,acting as a scribe for [employer], MD.,have documented all relevant documentation on the behalf of Eulis Foster, MD,as directed by  Eulis Foster, MD while in the presence of Eulis Foster, MD.   Established patient visit   Patient: Nancy Ramos   DOB: 1990-08-14   31 y.o. Female  MRN: [id number] Visit Date: 05/21/2022  Today's healthcare provider: Eulis Foster, MD   Chief Complaint  Patient presents with   Abdominal Pain   Subjective    HPI Patient here for ER follow-up. She was seen at [facility] and then at the ER  for abdominal pain.  Abdominal Pain  Patient complains of abdominal pain.  The pain is described as sharp, comes and goes, and is 10/10 in intensity.  Pain is located in the RLQ with radiation to back.  Onset was 6 days ago.  Symptoms have been getting better with pain but vomiting is worse since.  Aggravating factors: movement and pressure.  Alleviating factors: none.  Associated symptoms: none. The patient denies diarrhea and nausea currently. She states she has been able to temporarily   Medications: Outpatient Medications Prior to Visit  Medication Sig   cefdinir (OMNICEF) 300 MG capsule Take by mouth.   CLOMID 50 MG tablet Take by mouth.   fluconazole (DIFLUCAN) 150 MG tablet Take 1 tablet PO and repeat in 4 days   fluticasone (FLONASE) 50 MCG/ACT nasal spray Place 1 spray into both nostrils daily. (Patient taking differently: Place 1 spray into both nostrils as needed.)   HYDROcodone-acetaminophen (NORCO) 5-325 MG tablet Take 1 tablet by mouth every 6 (six) hours as needed for moderate pain.   lactulose (CHRONULAC) 10 GM/15ML solution Take 30 mLs (20 g total) by mouth daily as needed for mild constipation.   nortriptyline (PAMELOR) 10 MG capsule Take 1 capsule (10 mg total) by mouth at bedtime.   nystatin-triamcinolone ointment (MYCOLOG) Apply 1 Application topically 2 (two)  times daily.   ondansetron (ZOFRAN) 4 MG tablet Take 1 tablet (4 mg total) by mouth every 8 (eight) hours as needed for nausea or vomiting.   SUMAtriptan (IMITREX) 100 MG tablet Take 1 tablet (100 mg total) by mouth once as needed for up to 1 dose for migraine. May repeat in 2 hours if headache persists or recurs.   No facility-administered medications prior to visit.    Review of Systems     Objective    BP 118/89 (BP Location: Left Arm, Patient Position: Sitting, Cuff Size: Large)   Pulse 74   Temp 98.3 F (36.8 C) (Oral)   Resp 16   Ht 5\' 1"  (1.549 m)   Wt 225 lb (102.1 kg)   LMP 05/06/2022 (Exact Date)   SpO2 99%   BMI 42.51 kg/m    Physical Exam Constitutional:      Appearance: She is well-developed. She is ill-appearing.  Cardiovascular:     Rate and Rhythm: Normal rate and regular rhythm.     Heart sounds: Normal heart sounds.  Pulmonary:     Effort: Pulmonary effort is normal. No respiratory distress.     Breath sounds: No wheezing, rhonchi or rales.  Abdominal:     General: A surgical scar is present. Bowel sounds are decreased. There is no distension.     Palpations: Abdomen is soft.     Tenderness: There is abdominal tenderness in the suprapubic area. There is no right CVA tenderness, left CVA tenderness, guarding or rebound. Negative signs include Murphy's sign,  Rovsing's sign and McBurney's sign.  Skin:    General: Skin is warm and dry.     Capillary Refill: Capillary refill takes less than 2 seconds.     Coloration: Skin is not mottled.     Findings: No erythema or rash.  Neurological:     General: No focal deficit present.     Mental Status: She is alert and oriented to person, place, and time.       Results for orders placed or performed in visit on 05/21/22  POCT urine pregnancy  Result Value Ref Range   Preg Test, Ur Negative Negative  POCT Urinalysis Dipstick  Result Value Ref Range   Color, UA yellow    Clarity, UA clear    Glucose, UA  Negative Negative   Bilirubin, UA negative    Ketones, UA negative    Spec Grav, UA 1.015 1.010 - 1.025   Blood, UA positive    pH, UA 6.0 5.0 - 8.0   Protein, UA Negative Negative   Urobilinogen, UA 0.2 0.2 or 1.0 E.U./dL   Nitrite, UA negative    Leukocytes, UA Negative Negative   Appearance     Odor       Assessment & Plan     Problem List Items Addressed This Visit       Other   Right lower quadrant abdominal pain - Primary    Intermittently worsening right lower quadrant abdominal pain She is not in distress at this time and has normal abdominal exam with soft abdomen and no Murphy sign, no Rovsing sign, no McBurney's point sign Recommended that patient continue to hydrate orally We will recheck urine pregnancy test, low suspicion for pregnancy as patient recently finished menses over the weekend however she notes some abnormalities so we will retest We will also recheck urine analysis and collect urine culture Recommended that she stop cefdinir as she may be experiencing influx of changes to the gastric mucosa causing her symptoms Also recommended that she stop the lactulose that she is having loose stools We talked about tier treatment of constipation starting with MiraLAX if she is beginning to show signs of constipation follow-up by senna and then lactulose if constipation persist for several days Recommended over-the-counter Gas-X or Maalox for symptoms as well as slowly progressing her diet as I think that she reintroduce fatty foods and alcohol too quickly after recovering from a likely gastroenteritis illness She should follow-up as needed Reviewed ED precautions including inability to tolerate any oral fluids or the development of bloody stools or vomit      Relevant Orders   POCT urine pregnancy (Completed)   Urine Culture   POCT Urinalysis Dipstick (Completed)   Other Visit Diagnoses     Other microscopic hematuria       Relevant Orders   Urinalysis,  Routine w reflex microscopic        Return in about 3 weeks (around 06/11/2022), or if symptoms worsen or fail to improve, for repeat UA .      I, Ronnald Ramp, MD, have reviewed all documentation for this visit.  Portions of this information were initially documented by the CMA and reviewed by me for thoroughness and accuracy.    Ronnald Ramp, MD  [facility] [phone] (phone) [phone] (fax)  [facility]

## 2022-05-22 LAB — URINALYSIS, ROUTINE W REFLEX MICROSCOPIC
Bilirubin, UA: NEGATIVE
Glucose, UA: NEGATIVE
Ketones, UA: NEGATIVE
Leukocytes,UA: NEGATIVE
Nitrite, UA: NEGATIVE
Protein,UA: NEGATIVE
RBC, UA: NEGATIVE
Specific Gravity, UA: 1.009 (ref 1.005–1.030)
Urobilinogen, Ur: 0.2 mg/dL (ref 0.2–1.0)
pH, UA: 6 (ref 5.0–7.5)

## 2022-05-22 LAB — SPECIMEN STATUS REPORT

## 2022-05-23 ENCOUNTER — Ambulatory Visit: Payer: BC Managed Care – PPO | Admitting: Family Medicine

## 2022-05-23 LAB — URINE CULTURE

## 2022-06-01 ENCOUNTER — Ambulatory Visit: Payer: BC Managed Care – PPO | Admitting: Family Medicine

## 2022-06-15 ENCOUNTER — Ambulatory Visit: Payer: BC Managed Care – PPO | Admitting: Family Medicine

## 2022-06-25 NOTE — Progress Notes (Deleted)
      Established patient visit   Patient: Nancy Ramos   DOB: 29-Apr-1991   31 y.o. Female  MRN: [id number] Visit Date: 06/26/2022  Today's healthcare provider: Ronnald Ramp, MD   No chief complaint on file.  Subjective    HPI    Medications: Outpatient Medications Prior to Visit  Medication Sig   CLOMID 50 MG tablet Take by mouth.   fluconazole (DIFLUCAN) 150 MG tablet Take 1 tablet PO and repeat in 4 days   fluticasone (FLONASE) 50 MCG/ACT nasal spray Place 1 spray into both nostrils daily. (Patient taking differently: Place 1 spray into both nostrils as needed.)   HYDROcodone-acetaminophen (NORCO) 5-325 MG tablet Take 1 tablet by mouth every 6 (six) hours as needed for moderate pain.   lactulose (CHRONULAC) 10 GM/15ML solution Take 30 mLs (20 g total) by mouth daily as needed for mild constipation.   nortriptyline (PAMELOR) 10 MG capsule Take 1 capsule (10 mg total) by mouth at bedtime.   nystatin-triamcinolone ointment (MYCOLOG) Apply 1 Application topically 2 (two) times daily.   ondansetron (ZOFRAN) 4 MG tablet Take 1 tablet (4 mg total) by mouth every 8 (eight) hours as needed for nausea or vomiting.   SUMAtriptan (IMITREX) 100 MG tablet Take 1 tablet (100 mg total) by mouth once as needed for up to 1 dose for migraine. May repeat in 2 hours if headache persists or recurs.   No facility-administered medications prior to visit.    Review of Systems  {Labs  Heme  Chem  Endocrine  Serology  Results Review (optional):23779}   Objective    There were no vitals taken for this visit. {Show previous vital signs (optional):23777}  Physical Exam  ***  No results found for any visits on 06/26/22.  Assessment & Plan     Problem List Items Addressed This Visit   None    No follow-ups on file.      I, Joseline E Rosas, CMA, have reviewed all documentation for this visit.  Portions of this information were initially documented by the CMA and  reviewed by me for thoroughness and accuracy.      Ronnald Ramp, MD  [facility] [phone] (phone) [phone] (fax)  [facility]

## 2022-06-26 ENCOUNTER — Ambulatory Visit: Payer: BC Managed Care – PPO | Admitting: Family Medicine

## 2022-07-30 ENCOUNTER — Encounter: Payer: Self-pay | Admitting: Neurology

## 2022-08-02 IMAGING — US US PELVIS COMPLETE WITH TRANSVAGINAL
1 series · 14 of 25 positions shown · non-contrast
Comparison: None

CLINICAL DATA: History of polycystic ovarian syndrome, LMP
06/05/2021



[Series 1: us pelvic complete with transvaginal · 14 of 88 slices shown]
[im 1/88]
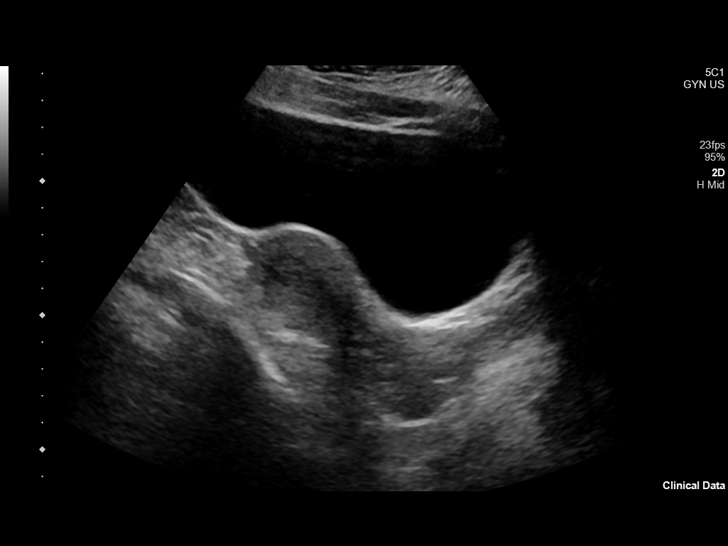
[im 8/88]
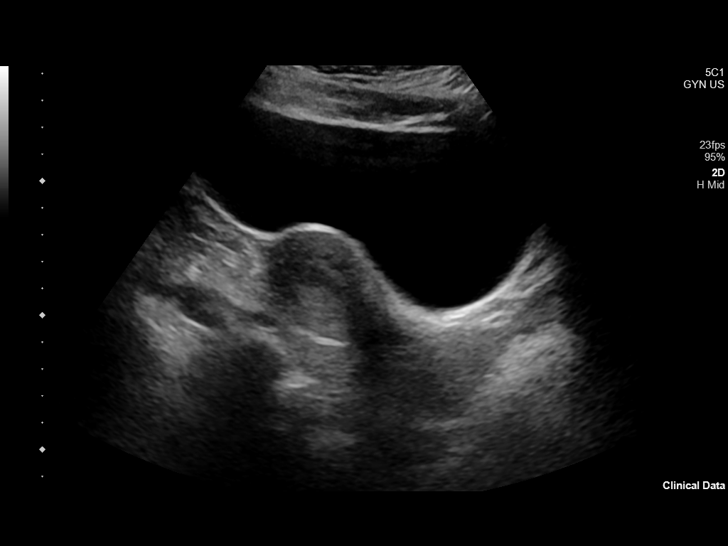
[im 15/88]
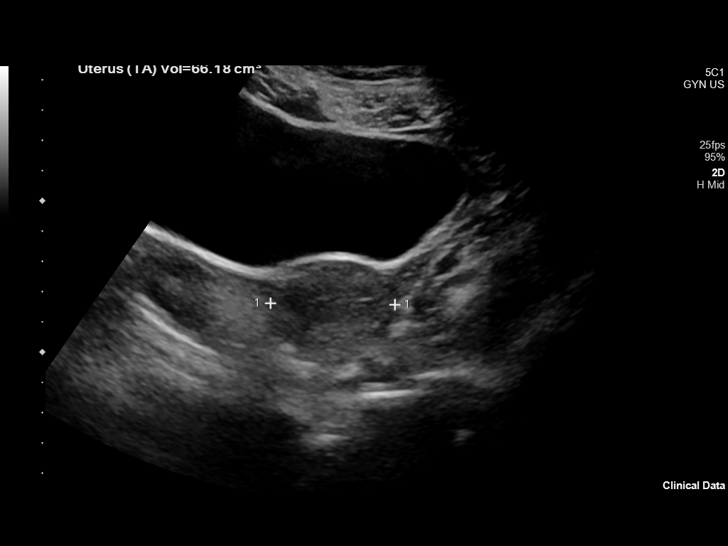
[im 22/88]
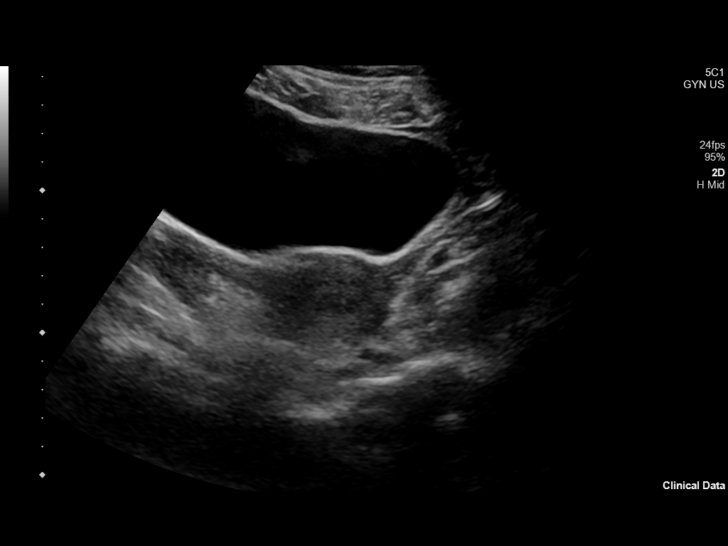
[im 30/88]
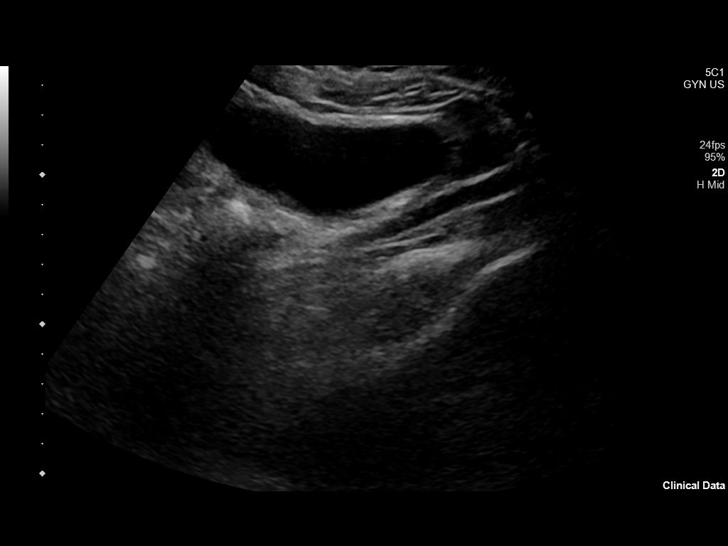
[im 33/88]
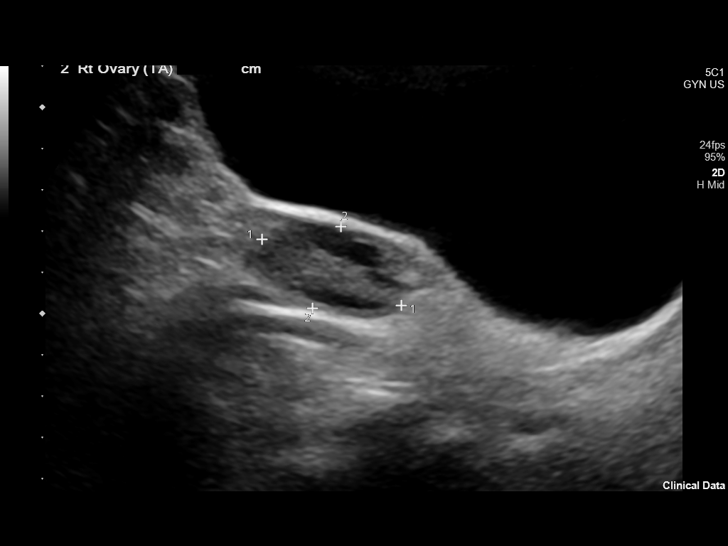
[im 40/88]
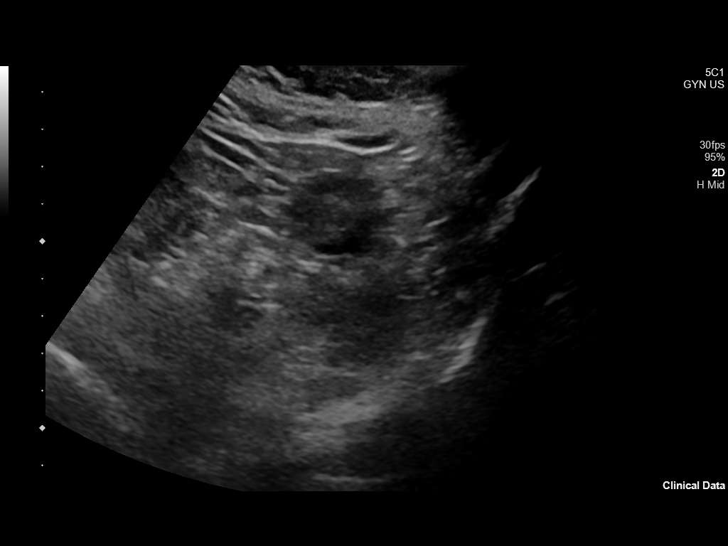
[im 48/88]
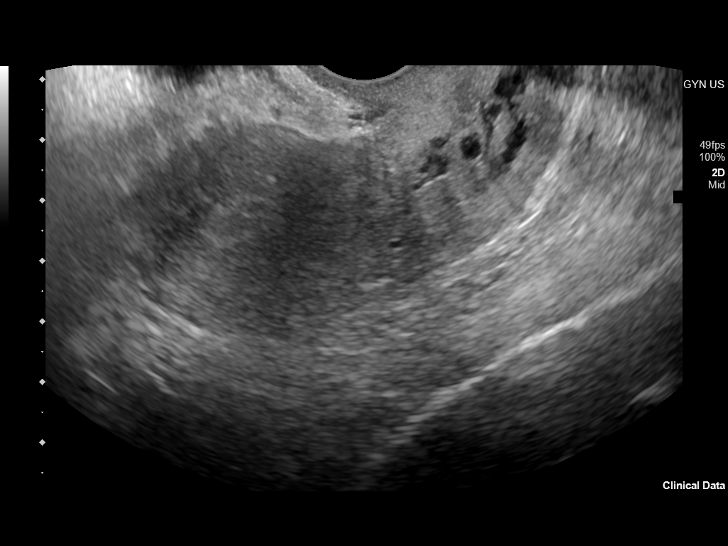
[im 55/88]
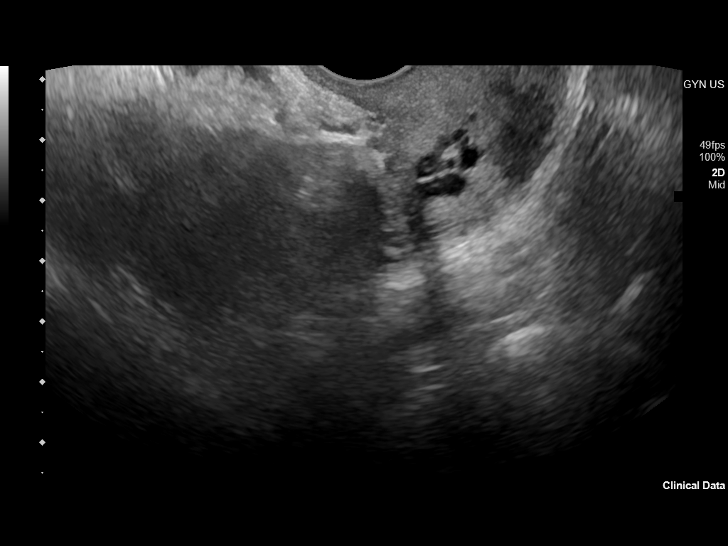
[im 59/88]
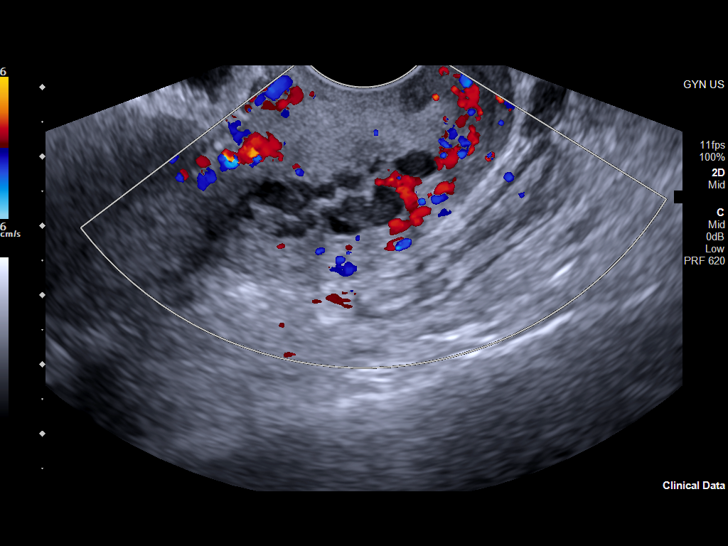
[im 66/88]
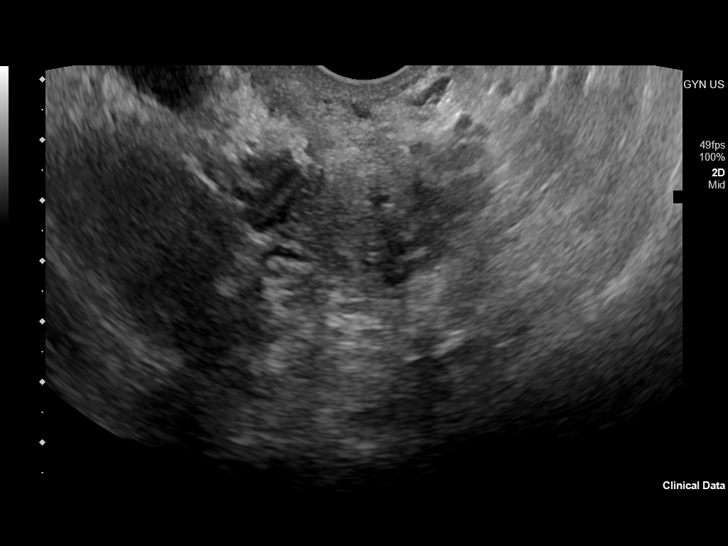
[im 73/88]
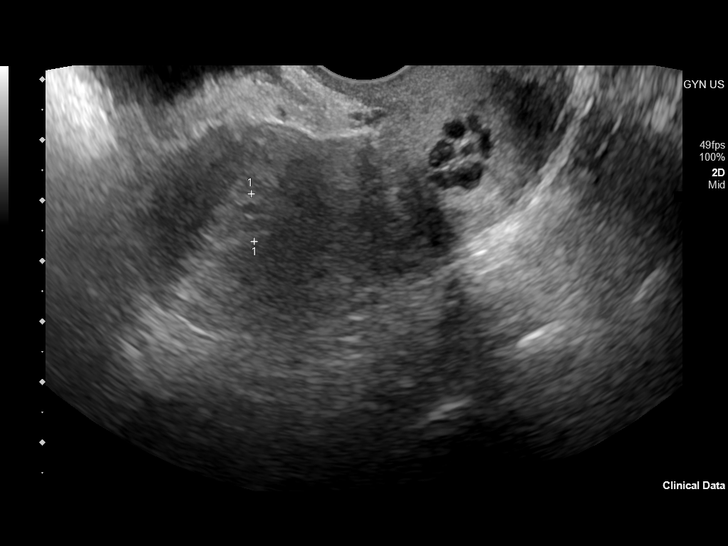
[im 80/88]
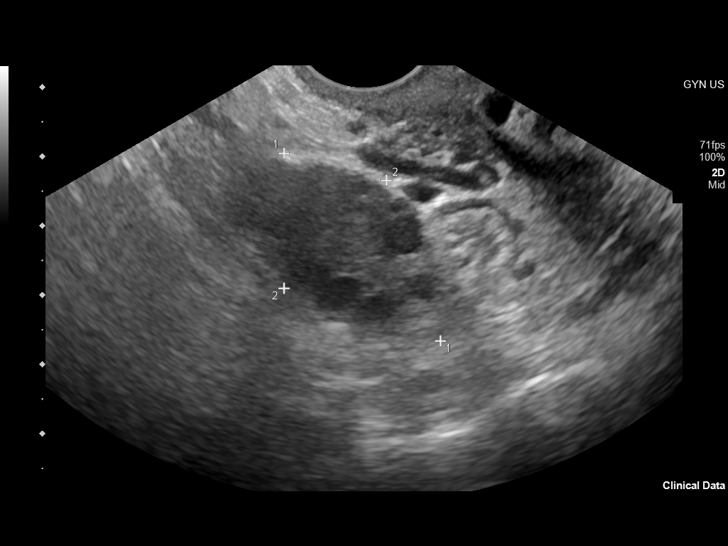
[im 88/88]
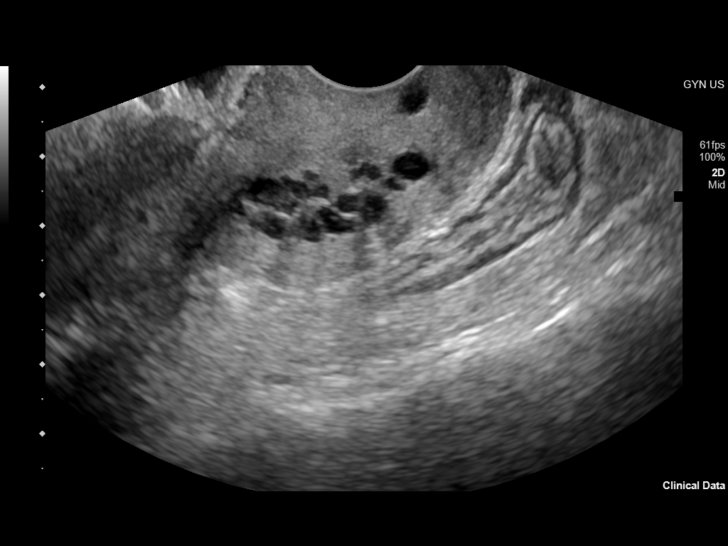

[14 of 25 positions shown; findings below may reference images not displayed]

FINDINGS: Uterus

Measurements: 8.9 x 3.4 x 4.1 cm = volume: 66 mL. Anteverted.
Slightly heterogeneous myometrium. No focal mass. Multiple nabothian
cysts at cervix.

Endometrium

Thickness: 10 mm.  No endometrial fluid or mass

Right ovary

Measurements: 3.7 x 2.1 x 2.5 cm = volume: 10.1 mL. Normal
morphology without mass

Left ovary

Measurements: 3.8 x 2.8 x 2.7 cm = volume: 15.4 mL. Normal
morphology without mass

Other findings

No free pelvic fluid.  No adnexal masses.
IMPRESSION: Normal exam.

## 2022-08-14 NOTE — Progress Notes (Deleted)
NEUROLOGY FOLLOW UP OFFICE NOTE  Nancy Ramos [id number]  Assessment/Plan:   Menstrual migraines, without status migrainosus, intractable  Migraine prevention.  *** Migraine rescue:  ***  Limit use of pain relievers to no more than 2 days out of week to prevent risk of rebound or medication-overuse headache. Keep headache diary Follow up ***     Subjective:  Nancy Ramos is a 32 year old female who follows up for migraines.  UPDATE: Last visit, we discontinued topiramate and rizatriptan and started nortriptyline, magnesium citrate and sumatriptan.  *** Nortriptyline Magnesium citrate 467m Zofran Sumatriptan  Stop topiramate and rizatriptan  Current NSAIDS/analgesics:  ibuprofen Current triptans:  sumatriptan 1055mCurrent ergotamine:  none Current anti-emetic:  Zofran 97m65murrent muscle relaxants:  none Current Antihypertensive medications:  none Current Antidepressant medications:  nortriptyline 60m3mS Current Anticonvulsant medications: none Current anti-CGRP:  none Current Vitamins/Herbal/Supplements:  magnesium citrate 400mg67mly Current Antihistamines/Decongestants:  Flonase Other therapy:  Vick's vapor rub, tight hat Hormone/birth control:  none     Caffeine:  1-2 large cups of coffee daily Alcohol:  2 times a month (social) Smoker:  no Diet: water throughout the day, eats healthy, sometimes skips meals (breakfast) Exercise:  tries to go to gym three times a week Depression:  ok; Anxiety:  ok Other pain:  no Sleep hygiene:  ok   HISTORY: Onset:  mid-20s, when she stopped birth control (Mirena) Location:  left frontal region gradually spreads to left face, ear pain Quality:  squeezing Intensity:  severe.   Aura:  absent Prodrome:  change in mood (irritable), decreased appetite Associated symptoms:  Nausea.  She denies associated vomiting, phonophobia, visual disturbance unilateral numbness or weakness. Duration:  4 days (starting 2  days before cycle - first 2 days severe, last 2 days lingering) Frequency:  Usually starts 2 days before cycle, initially once every 3 months, since June 2023, they have been once a month. Frequency of abortive medication: 4 days a month Triggers:  menstrual cycle, maybe change in weather Relieving factors:  Vick's vapor rub to the forehead, and wears a tight hat, hydration Activity:  aggravates.  Cannot function NOTE:  September has been more unpredictable.  Her migraine started 5 days before start of her cycle and lasted through her entire cycle.  She also had a migraine triggered by an alcoholic beverage.   Past NSAIDS/analgesics:  Excedrin Migraine, Tylenol, Aleve, Goody powder Past abortive triptans:  rizatriptan 60mg 38m abortive ergotamine:  none Past muscle relaxants:  tizanidine Past anti-emetic:  none Past antihypertensive medications:  none Past antidepressant medications:  none Past anticonvulsant medications:  topiramate (did not feel well) Past anti-CGRP:  none Past vitamins/Herbal/Supplements:  none Past antihistamines/decongestants:  none Other past therapies:  none    Family history of headache:  no    PAST MEDICAL HISTORY: Past Medical History:  Diagnosis Date   Calculus of kidney    Chlamydia    Polycystic ovaries     MEDICATIONS: Current Outpatient Medications on File Prior to Visit  Medication Sig Dispense Refill   CLOMID 50 MG tablet Take by mouth.     fluconazole (DIFLUCAN) 150 MG tablet Take 1 tablet PO and repeat in 4 days 2 tablet 0   fluticasone (FLONASE) 50 MCG/ACT nasal spray Place 1 spray into both nostrils daily. (Patient taking differently: Place 1 spray into both nostrils as needed.) 16 g 2   HYDROcodone-acetaminophen (NORCO) 5-325 MG tablet Take 1 tablet by mouth every 6 (six) hours  as needed for moderate pain. 15 tablet 0   lactulose (CHRONULAC) 10 GM/15ML solution Take 30 mLs (20 g total) by mouth daily as needed for mild constipation. 120  mL 0   nortriptyline (PAMELOR) 10 MG capsule Take 1 capsule (10 mg total) by mouth at bedtime. 30 capsule 5   nystatin-triamcinolone ointment (MYCOLOG) Apply 1 Application topically 2 (two) times daily. 30 g 0   ondansetron (ZOFRAN) 4 MG tablet Take 1 tablet (4 mg total) by mouth every 8 (eight) hours as needed for nausea or vomiting. 15 tablet 0   SUMAtriptan (IMITREX) 100 MG tablet Take 1 tablet (100 mg total) by mouth once as needed for up to 1 dose for migraine. May repeat in 2 hours if headache persists or recurs. 10 tablet 5   No current facility-administered medications on file prior to visit.    ALLERGIES: No Known Allergies  FAMILY HISTORY: Family History  Problem Relation Age of Onset   Hypertension Mother    Hypertension Father    Breast cancer Maternal Aunt        not sure of age      Objective:  *** General: No acute distress.  Patient appears ***-groomed.   Head:  Normocephalic/atraumatic Eyes:  Fundi examined but not visualized Neck: supple, no paraspinal tenderness, full range of motion Heart:  Regular rate and rhythm Lungs:  Clear to auscultation bilaterally Back: No paraspinal tenderness Neurological Exam: alert and oriented to person, place, and time.  Speech fluent and not dysarthric, language intact.  CN II-XII intact. Bulk and tone normal, muscle strength 5/5 throughout.  Sensation to light touch intact.  Deep tendon reflexes 2+ throughout, toes downgoing.  Finger to nose testing intact.  Gait normal, Romberg negative.   Metta Clines, DO  CC: ***

## 2022-08-15 ENCOUNTER — Encounter: Payer: Self-pay | Admitting: Neurology

## 2022-08-15 ENCOUNTER — Ambulatory Visit: Payer: BC Managed Care – PPO | Admitting: Neurology

## 2022-08-15 DIAGNOSIS — Z029 Encounter for administrative examinations, unspecified: Secondary | ICD-10-CM

## 2022-10-01 ENCOUNTER — Telehealth: Payer: Self-pay | Admitting: Neurology

## 2022-10-01 NOTE — Progress Notes (Signed)
NEUROLOGY FOLLOW UP OFFICE NOTE  SONG STEADY [id number]  Assessment/Plan:   Migraine without aura, without status migrainosus, not intractable    Migraine prevention.  Increase nortriptyline to 25mg  at bedtime.  We can increase to 50mg  in 4 weeks if needed Migraine rescue: Sumatriptan 100mg .  Will also provide samples of Nurtec to try as well.  Zofran for nausea. Limit use of pain relievers to no more than 2 days out of week to prevent risk of rebound or medication-overuse headache. Keep headache diary Recommended getting updated eye exam. Follow up 6 months.     Subjective:  Nancy Ramos is a 32 year old female who follows up for migraines.  UPDATE: Intensity:  severe Duration:  4-5 hours with sumatriptan Frequency:  4 consecutive days (not week of menstrual cycle) She recently had a migraine but did not take sumatriptan right away.  Migraine progressed to severe left sided facial pain and swelling.    Current NSAIDS/analgesics:  ibuprofen Current triptans:  sumatriptan 100mg  Current ergotamine:  none Current anti-emetic:  Zofran 4mg  Current muscle relaxants:  none Current Antihypertensive medications:  none Current Antidepressant medications:  nortriptyline 10mg  QHS Current Anticonvulsant medications:  none Current anti-CGRP:  none Current Vitamins/Herbal/Supplements:  none Current Antihistamines/Decongestants:  Flonase Other therapy:  Vick's vapor rub, tight hat Hormone/birth control:  none     Caffeine:  1-2 large cups of coffee daily Alcohol:  2 times a month (social) Smoker:  no Diet: water throughout the day, eats healthy, sometimes skips meals (breakfast) Exercise:  tries to go to gym three times a week Depression:  ok; Anxiety:  ok Other pain:  no Sleep hygiene:  ok  HISTORY:  Onset:  mid-20s, when she stopped birth control (Mirena) Location:  left frontal region gradually spreads to left face, ear pain Quality:  squeezing Intensity:   severe.   Aura:  absent Prodrome:  change in mood (irritable), decreased appetite Associated symptoms:  Nausea.  She denies associated vomiting, phonophobia, visual disturbance unilateral numbness or weakness. Duration:  4 days (starting 2 days before cycle - first 2 days severe, last 2 days lingering) Frequency:  Usually starts 2 days before cycle, initially once every 3 months, since June 2023, they have been once a month. Frequency of abortive medication: 4 days a month Triggers:  menstrual cycle, maybe change in weather Relieving factors:  Vick's vapor rub to the forehead, and wears a tight hat, hydration Activity:  aggravates.  Cannot function NOTE:  September has been more unpredictable.  Her migraine started 5 days before start of her cycle and lasted through her entire cycle.  She also had a migraine triggered by an alcoholic beverage.   Past NSAIDS/analgesics:  Excedrin Migraine, Tylenol, Aleve, Goody powder Past abortive triptans:  rizatriptan Past abortive ergotamine:  none Past muscle relaxants:  tizanidine Past anti-emetic:  none Past antihypertensive medications:  none Past antidepressant medications:  none Past anticonvulsant medications:  topiramate (side effects) Past anti-CGRP:  none Past vitamins/Herbal/Supplements:  none Past antihistamines/decongestants:  none Other past therapies:  none    Family history of headache:  no  PAST MEDICAL HISTORY: Past Medical History:  Diagnosis Date   Calculus of kidney    Chlamydia    Polycystic ovaries     MEDICATIONS: Current Outpatient Medications on File Prior to Visit  Medication Sig Dispense Refill   CLOMID 50 MG tablet Take by mouth.     fluconazole (DIFLUCAN) 150 MG tablet Take 1 tablet PO and repeat in  4 days 2 tablet 0   fluticasone (FLONASE) 50 MCG/ACT nasal spray Place 1 spray into both nostrils daily. (Patient taking differently: Place 1 spray into both nostrils as needed.) 16 g 2   HYDROcodone-acetaminophen  (NORCO) 5-325 MG tablet Take 1 tablet by mouth every 6 (six) hours as needed for moderate pain. 15 tablet 0   lactulose (CHRONULAC) 10 GM/15ML solution Take 30 mLs (20 g total) by mouth daily as needed for mild constipation. 120 mL 0   nortriptyline (PAMELOR) 10 MG capsule Take 1 capsule (10 mg total) by mouth at bedtime. 30 capsule 5   nystatin-triamcinolone ointment (MYCOLOG) Apply 1 Application topically 2 (two) times daily. 30 g 0   ondansetron (ZOFRAN) 4 MG tablet Take 1 tablet (4 mg total) by mouth every 8 (eight) hours as needed for nausea or vomiting. 15 tablet 0   SUMAtriptan (IMITREX) 100 MG tablet Take 1 tablet (100 mg total) by mouth once as needed for up to 1 dose for migraine. May repeat in 2 hours if headache persists or recurs. 10 tablet 5   No current facility-administered medications on file prior to visit.    ALLERGIES: No Known Allergies  FAMILY HISTORY: Family History  Problem Relation Age of Onset   Hypertension Mother    Hypertension Father    Breast cancer Maternal Aunt        not sure of age      Objective:  Blood pressure 128/88, pulse 84, height 5' (1.524 m), weight 227 lb 9.6 oz (103.2 kg), SpO2 94 %. General: No acute distress.  Patient appears well-groomed.    Metta Clines, DO  CC: Tally Joe, FNP

## 2022-10-01 NOTE — Telephone Encounter (Signed)
Pt called in and left a message with the access nurse. Pt has been having migraines. She had one so bad yesterday it went down into her face. Rates current HA as 6/10. Her migraine medication isn't working. She also took Advil over the weekend. Pt is currently scheduled to be seen 10/02/22.

## 2022-10-01 NOTE — Telephone Encounter (Signed)
Patient is feeling okay, note she will follow up tomorrow as scheduled. Will go to ED if things change

## 2022-10-02 ENCOUNTER — Encounter: Payer: Self-pay | Admitting: Neurology

## 2022-10-02 ENCOUNTER — Ambulatory Visit: Payer: BC Managed Care – PPO | Admitting: Neurology

## 2022-10-02 DIAGNOSIS — R112 Nausea with vomiting, unspecified: Secondary | ICD-10-CM

## 2022-10-02 MED ORDER — ONDANSETRON HCL 4 MG PO TABS: 4.0000 mg | ORAL_TABLET | Freq: Three times a day (TID) | ORAL | 5 refills | Status: DC | PRN

## 2022-10-02 MED ORDER — SUMATRIPTAN SUCCINATE 100 MG PO TABS: 100.0000 mg | ORAL_TABLET | Freq: Once | ORAL | 5 refills | Status: AC | PRN

## 2022-10-02 MED ORDER — NORTRIPTYLINE HCL 25 MG PO CAPS: 25.0000 mg | ORAL_CAPSULE | Freq: Every day | ORAL | 3 refills | Status: DC

## 2022-10-02 NOTE — Patient Instructions (Signed)
Increase nortriptyline to 25mg  at bedtime.  If no improvement in 4 weeks, contact me When you get a migraine, try using Nurtec (1 pill daily as needed).  Let me know if it works.  Otherwise, may continue using sumatriptan Ondansetron for nausea

## 2022-10-25 ENCOUNTER — Other Ambulatory Visit: Payer: Self-pay | Admitting: Neurology

## 2022-10-25 ENCOUNTER — Encounter: Payer: Self-pay | Admitting: Neurology

## 2022-10-25 MED ORDER — NURTEC 75 MG PO TBDP: 75.0000 mg | ORAL_TABLET | Freq: Every day | ORAL | 11 refills | Status: DC | PRN

## 2023-01-18 ENCOUNTER — Other Ambulatory Visit: Payer: Self-pay | Admitting: Family Medicine

## 2023-01-18 DIAGNOSIS — B379 Candidiasis, unspecified: Secondary | ICD-10-CM

## 2023-01-18 NOTE — Telephone Encounter (Signed)
Unable to refill per protocol, Rx expired. Discontinued 10/02/22.  Requested Prescriptions  Pending Prescriptions Disp Refills   fluconazole (DIFLUCAN) 150 MG tablet [Pharmacy Med Name: FLUCONAZOLE 150MG  TABLETS] 2 tablet 0    Sig: TAKE 1 TABLET BY MOUTH ONCE AND REPEAT IN 4 DAYS.     Off-Protocol Failed - 01/18/2023  6:23 AM      Failed - Medication not assigned to a protocol, review manually.      Passed - Valid encounter within last 12 months    Recent Outpatient Visits           8 months ago Right lower quadrant abdominal pain   [facility] [place], [place], [place]   8 months ago Yeast infection   [facility] Merita Norton T, FNP   11 months ago Chronic migraine without aura without status migrainosus, not intractable   [facility] Jacky Kindle, FNP   1 year ago Acne vulgaris   [facility] Jacky Kindle, FNP   1 year ago Exposure to the flu   [facility] Jacky Kindle, FNP

## 2023-02-26 ENCOUNTER — Telehealth: Payer: BC Managed Care – PPO | Admitting: Physician Assistant

## 2023-02-26 DIAGNOSIS — H66009 Acute suppurative otitis media without spontaneous rupture of ear drum, unspecified ear: Secondary | ICD-10-CM

## 2023-02-26 MED ORDER — AMOXICILLIN 875 MG PO TABS: 875.0000 mg | ORAL_TABLET | Freq: Two times a day (BID) | ORAL | 0 refills | Status: DC

## 2023-02-26 NOTE — Progress Notes (Signed)
I have spent 5 minutes in review of e-visit questionnaire, review and updating patient chart, medical decision making and response to patient.   William Cody Martin, PA-C    

## 2023-02-26 NOTE — Progress Notes (Signed)
Message sent to patient requesting further input regarding current symptoms. Awaiting patient response.  

## 2023-02-26 NOTE — Progress Notes (Signed)
E-Visit for Ear Pain - Acute Otitis Media   We are sorry that you are not feeling well. Here is how we plan to help!  Based on what you have shared with me it looks like you have Acute Otitis Media.  Acute Otitis Media is an infection of the middle or "inner" ear. This type of infection can cause redness, inflammation, and fluid buildup behind the tympanic membrane (ear drum).  The usual symptoms include: Earache/Pain Fever Upper respiratory symptoms Lack of energy/Fatigue/Malaise Slight hearing loss gradually worsening- if the inner ear fills with fluid What causes middle ear infections? Most middle ear infections occur when an infection such as a cold, leads to a build-up of mucus in the middle ear and causes the Eustachian tube (a thin tube that runs from the middle ear to the back of the nose) to become swollen or blocked.   This means mucus can't drain away properly, making it easier for an infection to spread into the middle ear.  How middle ear infections are treated: Most ear infections clear up within three to five days and don't need any specific treatment. If necessary, tylenol or ibuprofen should be used to relieve pain and a high temperature.  If you develop a fever higher than 102, or any significantly worsening symptoms, this could indicate a more serious infection moving to the middle/inner and needs face to face evaluation in an office by a provider.   Antibiotics aren't routinely used to treat middle ear infections, although they may occasionally be prescribed if symptoms persist or are particularly severe. Given your presentation,   I have prescribed Amoxicillin 875 mg one tablet twice daily for 10 days   Your symptoms should improve over the next 3 days and should resolve in about 7 days. Be sure to complete ALL of the prescription(s) given.  HOME CARE: Wash your hands frequently. If you are prescribed an ear drop, do not place the tip of the bottle on your ear or  touch it with your fingers. You can take Acetaminophen 650 mg every 4-6 hours as needed for pain.  If pain is severe or moderate, you can apply a heating pad (set on low) or hot water bottle (wrapped in a towel) to outer ear for 20 minutes.  This will also increase drainage.  GET HELP RIGHT AWAY IF: Fever is over 102.2 degrees. You develop progressive ear pain or hearing loss. Ear symptoms persist longer than 3 days after treatment.  MAKE SURE YOU: Understand these instructions. Will watch your condition. Will get help right away if you are not doing well or get worse.  Thank you for choosing an e-visit.  Your e-visit answers were reviewed by a board certified advanced clinical practitioner to complete your personal care plan. Depending upon the condition, your plan could have included both over the counter or prescription medications.  Please review your pharmacy choice. Make sure the pharmacy is open so you can pick up the prescription now. If there is a problem, you may contact your provider through MyChart messaging and have the prescription routed to another pharmacy.  Your safety is important to us. If you have drug allergies check your prescription carefully.   For the next 24 hours you can use MyChart to ask questions about today's visit, request a non-urgent call back, or ask for a work or school excuse. You will get an email with a survey after your eVisit asking about your experience. We would appreciate your feedback. I hope   that your e-visit has been valuable and will aid in your recovery.   

## 2023-02-27 ENCOUNTER — Ambulatory Visit: Payer: BC Managed Care – PPO | Admitting: Physician Assistant

## 2023-02-27 ENCOUNTER — Encounter: Payer: Self-pay | Admitting: Physician Assistant

## 2023-02-27 VITALS — BP 115/92 | HR 80 | Ht 60.0 in | Wt 228.0 lb

## 2023-02-27 DIAGNOSIS — H9202 Otalgia, left ear: Secondary | ICD-10-CM | POA: Diagnosis not present

## 2023-02-27 DIAGNOSIS — R519 Headache, unspecified: Secondary | ICD-10-CM

## 2023-02-27 DIAGNOSIS — G4489 Other headache syndrome: Secondary | ICD-10-CM | POA: Diagnosis not present

## 2023-02-27 NOTE — Progress Notes (Unsigned)
Established patient visit  Patient: Nancy Ramos   DOB: February 04, 1991   31 y.o. Female  MRN: [id number] Visit Date: 02/27/2023  Today's healthcare provider: Debera Lat, PA-C   Chief Complaint  Patient presents with   Medical Management of Chronic Issues    Patient throat, head and left cheek and ear aches. The left ear has a ringing sensation. All symptoms started on Monday, prior treatment includes Advil and Amoxicillin, possible ear infection. 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  History of Present Illness   The patient, with a recent history of COVID-19, presents with left-sided facial pain that started on Monday. The pain, which includes the ear, cheek, and teeth, has been progressively worsening. The patient describes the ear pain as an intermittent aching sensation, accompanied by a ringing sound. The cheek pain is described as soreness, which later spread to the teeth. The patient also reports tenderness in the head, which started the day after the facial pain. The patient has been experiencing a sore throat, which is painful upon swallowing. The patient has been taking amoxicillin and Advil as prescribed by a telehealth provider, but these have not provided relief. The patient denies any changes in taste or problems with chewing, but reports post-nasal drainage.           02/27/2023    8:36 AM 05/21/2022    2:21 PM 04/26/2022   10:52 AM  Depression screen PHQ 2/9  Decreased Interest 0 0 0  Down, Depressed, Hopeless 0 0 0  PHQ - 2 Score 0 0 0  Altered sleeping 1 0 0  Tired, decreased energy 1 0 0  Change in appetite 0 0 0  Feeling bad or failure about yourself  0 0 0  Trouble concentrating 0 0 0  Moving slowly or fidgety/restless 0 0 0  Suicidal thoughts 0 0 0  PHQ-9 Score 2 0 0  Difficult doing work/chores  Not difficult  at all Not difficult at all      02/27/2023    8:36 AM  GAD 7 : Generalized Anxiety Score  Nervous, Anxious, on Edge 1  Control/stop worrying 0  Worry too much - different things 0  Trouble relaxing 0  Restless 0  Easily annoyed or irritable 0  Afraid - awful might happen 1  Total GAD 7 Score 2    Medications: Outpatient Medications Prior to Visit  Medication Sig   amoxicillin (AMOXIL) 875 MG tablet Take 1 tablet (875 mg total) by mouth 2 (two) times daily for 10 days.   CLOMID 50 MG tablet Take by mouth.   ondansetron (ZOFRAN) 4 MG tablet Take 1 tablet (4 mg total) by mouth every 8 (eight) hours as needed for nausea or vomiting.   Rimegepant Sulfate (NURTEC) 75 MG TBDP Take 1 tablet (75 mg total) by mouth daily as needed.   SUMAtriptan (IMITREX) 100 MG tablet Take 1 tablet (100 mg total) by mouth once as needed for up to 1 dose for migraine. May repeat in 2 hours if headache persists or recurs.   fluticasone (FLONASE) 50 MCG/ACT nasal spray Place 1 spray into both nostrils daily. (Patient not taking: Reported on 02/27/2023)   lactulose (CHRONULAC) 10 GM/15ML solution Take 30 mLs (20 g total) by mouth daily as needed for mild constipation. (Patient not taking: Reported on 02/27/2023)   nortriptyline (PAMELOR) 25  MG capsule Take 1 capsule (25 mg total) by mouth at bedtime. (Patient not taking: Reported on 02/27/2023)   No facility-administered medications prior to visit.    Review of Systems  All other systems reviewed and are negative.  Except see HPI   {Insert previous labs (optional):23779} {See past labs  Heme  Chem  Endocrine  Serology  Results Review (optional):1}   Objective    BP (!) 115/92 (BP Location: Left Arm, Patient Position: Sitting, Cuff Size: Large)   Pulse 80   Ht 5' (1.524 m)   Wt 228 lb (103.4 kg)   SpO2 100%   BMI 44.53 kg/m  {Insert last BP/Wt (optional):23777}{See vitals history (optional):1}   Physical Exam Vitals reviewed.  Constitutional:       General: She is not in acute distress.    Appearance: She is well-developed.  HENT:     Head: Normocephalic and atraumatic.     Right Ear: Ear canal and external ear normal.     Left Ear: Ear canal and external ear normal.     Nose: Congestion present. No rhinorrhea.     Mouth/Throat:     Pharynx: Posterior oropharyngeal erythema present.  Eyes:     General: No scleral icterus.    Extraocular Movements: Extraocular movements intact.     Conjunctiva/sclera: Conjunctivae normal.     Pupils: Pupils are equal, round, and reactive to light.  Cardiovascular:     Rate and Rhythm: Normal rate and regular rhythm.  Pulmonary:     Effort: Pulmonary effort is normal. No respiratory distress.  Skin:    General: Skin is warm and dry.     Findings: No rash.  Neurological:     Mental Status: She is alert and oriented to person, place, and time.  Psychiatric:        Behavior: Behavior normal.      No results found for any visits on 02/27/23.  Assessment & Plan        Parotid Gland Infection Recent viral infection (COVID-19) likely exacerbated underlying condition. Symptoms include pain in the left ear, cheek, and head, along with tenderness in the head. No fever or cough. Patient is currently on Amoxicillin 875mg  twice daily, which was started the day before the visit. -Continue Amoxicillin 875mg  twice daily. -Advise warm compresses and massages to the affected area. -Recommend nasal saline rinse and Flonase for congestion. -Advise warm salt gargles for throat discomfort. -Increase Advil to 600-800mg  for pain management, to be taken after meals. -Advise rest and hydration. -Follow-up appointment on Tuesday, March 05, 2023.  Post Nasal Drainage Likely related to the parotid gland infection and recent viral infection. -Advise Mucinex for symptom management. -Continue nasal saline rinse and Flonase for congestion.  Headache Likely related to the parotid gland infection. Not  responsive to migraine medication. -Increase Advil to 600-800mg  for pain management, to be taken after meals.      No follow-ups on file.     The patient was advised to call back or seek an in-person evaluation if the symptoms worsen or if the condition fails to improve as anticipated.  I discussed the assessment and treatment plan with the patient. The patient was provided an opportunity to ask questions and all were answered. The patient agreed with the plan and demonstrated an understanding of the instructions.  I, Debera Lat, PA-C have reviewed all documentation for this visit. The documentation on  02/27/23 for the exam, diagnosis, procedures, and orders are all accurate and complete.  Debera Lat, [facility], MMS [facility] [phone] (phone) [phone] (fax)  [facility]

## 2023-03-06 ENCOUNTER — Ambulatory Visit: Payer: BC Managed Care – PPO | Admitting: Family Medicine

## 2023-03-07 ENCOUNTER — Ambulatory Visit: Payer: Self-pay

## 2023-03-07 ENCOUNTER — Ambulatory Visit: Payer: BC Managed Care – PPO | Admitting: Family Medicine

## 2023-03-07 ENCOUNTER — Encounter: Payer: Self-pay | Admitting: Family Medicine

## 2023-03-07 VITALS — BP 118/63 | HR 93 | Ht 61.0 in | Wt 230.0 lb

## 2023-03-07 DIAGNOSIS — R590 Localized enlarged lymph nodes: Secondary | ICD-10-CM | POA: Diagnosis not present

## 2023-03-07 DIAGNOSIS — D72829 Elevated white blood cell count, unspecified: Secondary | ICD-10-CM | POA: Insufficient documentation

## 2023-03-07 DIAGNOSIS — N926 Irregular menstruation, unspecified: Secondary | ICD-10-CM | POA: Diagnosis not present

## 2023-03-07 DIAGNOSIS — R350 Frequency of micturition: Secondary | ICD-10-CM | POA: Diagnosis not present

## 2023-03-07 DIAGNOSIS — G500 Trigeminal neuralgia: Secondary | ICD-10-CM

## 2023-03-07 DIAGNOSIS — R319 Hematuria, unspecified: Secondary | ICD-10-CM | POA: Insufficient documentation

## 2023-03-07 LAB — POCT URINALYSIS DIPSTICK
Bilirubin, UA: NEGATIVE
Glucose, UA: NEGATIVE
Ketones, UA: NEGATIVE
Nitrite, UA: NEGATIVE
Protein, UA: NEGATIVE
Spec Grav, UA: 1.015 (ref 1.010–1.025)
Urobilinogen, UA: 0.2 U/dL
pH, UA: 6.5 (ref 5.0–8.0)

## 2023-03-07 LAB — POCT URINE PREGNANCY: Preg Test, Ur: NEGATIVE

## 2023-03-07 MED ORDER — PREDNISONE 50 MG PO TABS: 50.0000 mg | ORAL_TABLET | Freq: Every day | ORAL | 0 refills | Status: DC

## 2023-03-07 MED ORDER — GABAPENTIN 600 MG PO TABS
600.0000 mg | ORAL_TABLET | Freq: Every day | ORAL | 0 refills | Status: DC
Start: 2023-03-07 — End: 2023-04-11

## 2023-03-07 MED ORDER — DOXYCYCLINE HYCLATE 100 MG PO TABS: 100.0000 mg | ORAL_TABLET | Freq: Two times a day (BID) | ORAL | 0 refills | Status: DC

## 2023-03-07 MED ORDER — AZITHROMYCIN 500 MG PO TABS: 500.0000 mg | ORAL_TABLET | Freq: Every day | ORAL | 0 refills | Status: DC

## 2023-03-07 NOTE — Assessment & Plan Note (Signed)
Traditional L shape along nerve line following recent COVID and URI/Sinusitis Recommend trial of gabapentin following failed abx and steroids  Referrals to ENT and Neurology to assist

## 2023-03-07 NOTE — Telephone Encounter (Signed)
    Chief Complaint: Pt. Seen last week, has continued left ear pain and facial swelling. Pain is severe. Symptoms: Above Frequency: Last week Pertinent Negatives: Patient denies fever Disposition: [] ED /[] Urgent Care (no appt availability in office) / [x] Appointment(In office/virtual)/ []  [facility]/ [] Home Care/ [] Refused Recommended Disposition /[]  Mobile Bus/ []  Follow-up with PCP Additional Notes: Pt. Agrees with appointment today.  Reason for Disposition  [1] SEVERE pain AND [2] not improved 2 hours after taking analgesic medication (e.g., ibuprofen or acetaminophen)  Answer Assessment - Initial Assessment Questions 1. LOCATION: "Which ear is involved?"     Left ear and face, swelling 2. ONSET: "When did the ear start hurting"      Over 1 week 3. SEVERITY: "How bad is the pain?"  (Scale 1-10; mild, moderate or severe)   - MILD (1-3): doesn't interfere with normal activities    - MODERATE (4-7): interferes with normal activities or awakens from sleep    - SEVERE (8-10): excruciating pain, unable to do any normal activities      Severe 4. URI SYMPTOMS: "Do you have a runny nose or cough?"     No 5. FEVER: "Do you have a fever?" If Yes, ask: "What is your temperature, how was it measured, and when did it start?"     No 6. CAUSE: "Have you been swimming recently?", "How often do you use Q-TIPS?", "Have you had any recent air travel or scuba diving?"     No 7. OTHER SYMPTOMS: "Do you have any other symptoms?" (e.g., headache, stiff neck, dizziness, vomiting, runny nose, decreased hearing)     Headache 8. PREGNANCY: "Is there any chance you are pregnant?" "When was your last menstrual period?"     No  Protocols used: Davina Poke

## 2023-03-07 NOTE — Assessment & Plan Note (Signed)
UA positive; recommend Ucx Pt declines c/f UTI

## 2023-03-07 NOTE — Assessment & Plan Note (Signed)
Chronic, Body mass index is 43.46 kg/m. Encouraged to call insurance to discuss weight loss options

## 2023-03-07 NOTE — Progress Notes (Signed)
Established patient visit   Patient: Nancy Ramos   DOB: 15-Jan-1991   32 y.o. Female  MRN: [id number] Visit Date: 03/07/2023  Today's healthcare provider: Jacky Kindle, FNP  Introduced to nurse practitioner role and practice setting.  All questions answered.  Discussed provider/patient relationship and expectations.  Subjective    HPI HPI     Ear Pain    Additional comments: Patient is present due to continued left ear pain, associated with facial swelling. Patient reports she completed 5 days worth         Comments   Patient also reports no menstrual since July 29 and would like blood work completed. Symptoms of breast tenderness and urinary frequency      Last edited by Acey Lav, CMA on 03/07/2023  3:23 PM.      Medications: Outpatient Medications Prior to Visit  Medication Sig   fluticasone (FLONASE) 50 MCG/ACT nasal spray Place 1 spray into both nostrils daily.   ondansetron (ZOFRAN) 4 MG tablet Take 1 tablet (4 mg total) by mouth every 8 (eight) hours as needed for nausea or vomiting.   Rimegepant Sulfate (NURTEC) 75 MG TBDP Take 1 tablet (75 mg total) by mouth daily as needed.   SUMAtriptan (IMITREX) 100 MG tablet Take 1 tablet (100 mg total) by mouth once as needed for up to 1 dose for migraine. May repeat in 2 hours if headache persists or recurs.   CLOMID 50 MG tablet Take by mouth. (Patient not taking: Reported on 03/07/2023)   lactulose (CHRONULAC) 10 GM/15ML solution Take 30 mLs (20 g total) by mouth daily as needed for mild constipation. (Patient not taking: Reported on 02/27/2023)   nortriptyline (PAMELOR) 25 MG capsule Take 1 capsule (25 mg total) by mouth at bedtime. (Patient not taking: Reported on 02/27/2023)   [DISCONTINUED] amoxicillin (AMOXIL) 875 MG tablet Take 1 tablet (875 mg total) by mouth 2 (two) times daily for 10 days. (Patient not taking: Reported on 03/07/2023)   No facility-administered medications prior to visit.     Objective     BP 118/63 (BP Location: Right Arm, Patient Position: Sitting, Cuff Size: Large)   Pulse 93   Ht 5\' 1"  (1.549 m)   Wt 230 lb (104.3 kg)   LMP 01/28/2023   SpO2 100%   BMI 43.46 kg/m   Physical Exam Vitals and nursing note reviewed.  Constitutional:      General: She is not in acute distress.    Appearance: Normal appearance. She is obese. She is not ill-appearing, toxic-appearing or diaphoretic.  HENT:     Head: Normocephalic and atraumatic.     Jaw: Tenderness, swelling and pain on movement present. No trismus or malocclusion.     Salivary Glands: Left salivary gland is not diffusely enlarged or tender.      Right Ear: Tympanic membrane, ear canal and external ear normal.     Left Ear: Tympanic membrane, ear canal and external ear normal. Tenderness present.     Nose: Nose normal.     Mouth/Throat:     Mouth: Mucous membranes are moist.     Pharynx: Oropharynx is clear. No oropharyngeal exudate or posterior oropharyngeal erythema.     Comments: Denies dental concerns Eyes:     Extraocular Movements: Extraocular movements intact.     Conjunctiva/sclera: Conjunctivae normal.     Pupils: Pupils are equal, round, and reactive to light.  Neck:     Thyroid: No thyroid mass, thyromegaly or thyroid tenderness.  Comments: 1.5 cm inflamed lymph node Cardiovascular:     Rate and Rhythm: Normal rate and regular rhythm.     Pulses: Normal pulses.     Heart sounds: Normal heart sounds. No murmur heard.    No friction rub. No gallop.  Pulmonary:     Effort: Pulmonary effort is normal. No respiratory distress.     Breath sounds: Normal breath sounds. No stridor. No wheezing, rhonchi or rales.  Chest:     Chest wall: No tenderness.  Musculoskeletal:        General: No swelling, deformity or signs of injury. Normal range of motion.     Cervical back: Normal range of motion and neck supple. Tenderness present.     Right lower leg: No edema.     Left lower leg: No edema.   Lymphadenopathy:     Cervical: Cervical adenopathy present.     Left cervical: Deep cervical adenopathy present.  Skin:    General: Skin is warm and dry.     Capillary Refill: Capillary refill takes less than 2 seconds.     Coloration: Skin is not jaundiced or pale.     Findings: No bruising, erythema, lesion or rash.  Neurological:     General: No focal deficit present.     Mental Status: She is alert and oriented to person, place, and time. Mental status is at baseline.     Cranial Nerves: No cranial nerve deficit.     Sensory: No sensory deficit.     Motor: No weakness.     Coordination: Coordination normal.  Psychiatric:        Mood and Affect: Mood normal.        Behavior: Behavior normal.        Thought Content: Thought content normal.        Judgment: Judgment normal.     Results for orders placed or performed in visit on 03/07/23  POCT urine pregnancy  Result Value Ref Range   Preg Test, Ur Negative Negative  POCT Urinalysis Dipstick  Result Value Ref Range   Color, UA     Clarity, UA     Glucose, UA Negative Negative   Bilirubin, UA negative    Ketones, UA negative    Spec Grav, UA 1.015 1.010 - 1.025   Blood, UA trace    pH, UA 6.5 5.0 - 8.0   Protein, UA Negative Negative   Urobilinogen, UA 0.2 0.2 or 1.0 E.U./dL   Nitrite, UA negative    Leukocytes, UA Moderate (2+) (A) Negative   Appearance     Odor      Assessment & Plan     Problem List Items Addressed This Visit       Nervous and Auditory   Trigeminal neuralgia of left side of face    Traditional L shape along nerve line following recent COVID and URI/Sinusitis Recommend trial of gabapentin following failed abx and steroids  Referrals to ENT and Neurology to assist       Relevant Medications   gabapentin (NEURONTIN) 600 MG tablet   Other Relevant Orders   Ambulatory referral to ENT   Ambulatory referral to Neurology     Immune and Lymphatic   Reactive cervical lymphadenopathy - Primary     Following recent COVID infection and sinusitis; treated x 10 days with amox 875 mg Normal TM today; however, ongoing pain and tenderness to L side face and neck       Relevant Medications  doxycycline (VIBRA-TABS) 100 MG tablet   predniSONE (DELTASONE) 50 MG tablet   azithromycin (ZITHROMAX) 500 MG tablet   Other Relevant Orders   CBC with Differential/Platelet   Comprehensive Metabolic Panel (CMET)   Lipid panel   TSH   Hemoglobin A1c   hCG, serum, qualitative   HIV antibody (with reflex)   Hepatitis C Antibody   RPR   QuantiFERON-TB Gold Plus     Other   Hematuria   Relevant Orders   Urine Culture   Leukocytosis   Relevant Orders   Urine Culture   Missed menses   Relevant Orders   POCT urine pregnancy (Completed)   Morbid obesity (HCC)    Chronic, Body mass index is 43.46 kg/m. Encouraged to call insurance to discuss weight loss options       Urinary frequency    UA positive; recommend Ucx Pt declines c/f UTI      Relevant Orders   POCT Urinalysis Dipstick (Completed)   No follow-ups on file.     Leilani Merl, FNP, have reviewed all documentation for this visit. The documentation on 03/07/23 for the exam, diagnosis, procedures, and orders are all accurate and complete.  Jacky Kindle, FNP  [facility] [phone] (phone) [phone] (fax)  [facility]

## 2023-03-07 NOTE — Assessment & Plan Note (Signed)
Following recent COVID infection and sinusitis; treated x 10 days with amox 875 mg Normal TM today; however, ongoing pain and tenderness to L side face and neck

## 2023-03-07 NOTE — Patient Instructions (Signed)
Treatment with ABX and Steroids; if not improved, start daily anticonvulsant to assist with nerve pain and follow up with your neurologist

## 2023-03-10 LAB — CBC WITH DIFFERENTIAL/PLATELET
Basophils Absolute: 0 10*3/uL (ref 0.0–0.2)
Basos: 0 %
EOS (ABSOLUTE): 0.1 10*3/uL (ref 0.0–0.4)
Eos: 1 %
Hematocrit: 38.7 % (ref 34.0–46.6)
Hemoglobin: 12.8 g/dL (ref 11.1–15.9)
Immature Grans (Abs): 0 10*3/uL (ref 0.0–0.1)
Immature Granulocytes: 0 %
Lymphocytes Absolute: 2.5 10*3/uL (ref 0.7–3.1)
Lymphs: 28 %
MCH: 28.9 pg (ref 26.6–33.0)
MCHC: 33.1 g/dL (ref 31.5–35.7)
MCV: 87 fL (ref 79–97)
Monocytes Absolute: 0.9 10*3/uL (ref 0.1–0.9)
Monocytes: 10 %
Neutrophils Absolute: 5.4 10*3/uL (ref 1.4–7.0)
Neutrophils: 61 %
Platelets: 219 10*3/uL (ref 150–450)
RBC: 4.43 x10E6/uL (ref 3.77–5.28)
RDW: 12.6 % (ref 11.7–15.4)
WBC: 8.9 10*3/uL (ref 3.4–10.8)

## 2023-03-10 LAB — LIPID PANEL
Chol/HDL Ratio: 2.9 ratio (ref 0.0–4.4)
Cholesterol, Total: 140 mg/dL (ref 100–199)
HDL: 49 mg/dL (ref >39)
LDL Chol Calc (NIH): 65 mg/dL (ref 0–99)
Triglycerides: 150 mg/dL — ABNORMAL HIGH (ref 0–149)
VLDL Cholesterol Cal: 26 mg/dL (ref 5–40)

## 2023-03-10 LAB — COMPREHENSIVE METABOLIC PANEL
ALT: 16 IU/L (ref 0–32)
AST: 21 IU/L (ref 0–40)
Albumin: 4.1 g/dL (ref 4.0–5.0)
Alkaline Phosphatase: 88 IU/L (ref 44–121)
BUN/Creatinine Ratio: 17 (ref 9–23)
BUN: 14 mg/dL (ref 6–20)
Bilirubin Total: <0.2 mg/dL (ref 0.0–1.2)
CO2: 22 mmol/L (ref 20–29)
Calcium: 9.2 mg/dL (ref 8.7–10.2)
Chloride: 102 mmol/L (ref 96–106)
Creatinine, Ser: 0.83 mg/dL (ref 0.57–1.00)
Globulin, Total: 2.8 g/dL (ref 1.5–4.5)
Glucose: 90 mg/dL (ref 70–99)
Potassium: 4.2 mmol/L (ref 3.5–5.2)
Sodium: 137 mmol/L (ref 134–144)
Total Protein: 6.9 g/dL (ref 6.0–8.5)
eGFR: 97 mL/min/{1.73_m2} (ref >59)

## 2023-03-10 LAB — QUANTIFERON-TB GOLD PLUS
QuantiFERON Mitogen Value: >10 [IU]/mL
QuantiFERON Nil Value: 0.01 [IU]/mL
QuantiFERON TB1 Ag Value: 0.06 [IU]/mL
QuantiFERON TB2 Ag Value: 0.07 [IU]/mL
QuantiFERON-TB Gold Plus: NEGATIVE

## 2023-03-10 LAB — TSH: TSH: 0.935 u[IU]/mL (ref 0.450–4.500)

## 2023-03-10 LAB — HEMOGLOBIN A1C
Est. average glucose Bld gHb Est-mCnc: 111 mg/dL
Hgb A1c MFr Bld: 5.5 % (ref 4.8–5.6)

## 2023-03-10 LAB — URINE CULTURE

## 2023-03-10 LAB — RPR: RPR Ser Ql: NONREACTIVE

## 2023-03-10 LAB — HCG, SERUM, QUALITATIVE: hCG,Beta Subunit,Qual,Serum: NEGATIVE m[IU]/mL (ref <6)

## 2023-03-10 LAB — HEPATITIS C ANTIBODY: Hep C Virus Ab: NONREACTIVE

## 2023-03-10 LAB — HIV ANTIBODY (ROUTINE TESTING W REFLEX): HIV Screen 4th Generation wRfx: NONREACTIVE

## 2023-04-04 NOTE — Progress Notes (Signed)
NEUROLOGY FOLLOW UP OFFICE NOTE  AZRIEL DANCY [id number]  Assessment/Plan:   Migraine without aura, with status migrainosus, intractable    Migraine prevention.  Continue nortriptyline 25mg  at bedtime.  We can increase to 50mg  in 4 weeks if needed Migraine rescue: Sumatriptan 100mg .  Zofran for nausea. Will provide her with a prescription for a prednisone taper to take if she has a recurrent intractable migraine in [place].   Limit use of pain relievers to no more than 2 days out of week to prevent risk of rebound or medication-overuse headache. Keep headache diary Follow up 6 months.     Subjective:  Nancy Ramos is a 32 year old female who follows up for migraines.  UPDATE: Increased nortriptyline.  She hadn't had an intractable migraine for months.  She stopped taking nortriptyline 2 months ago because it was affecting her sleep for a couple of nights.  At the end of August, she developed another intractable migraine but the pain was more severe and it lasted 10 days instead of 4 days.  It did not respond to sumatriptan.  She took Nurtec daily for that time, which was ineffective.  She didn't have a period that month, which made it more unusual.  She saw the dentist who told her that they didn't find anything wrong with her teeth.  She restarted nortriptyline about 2 weeks ago and has been tolerating it now.  No migraines.  She is going out of the country to [place] in a couple of weeks and is anxious about having another attack.    Current NSAIDS/analgesics:  ibuprofen Current triptans:  sumatriptan 100mg   Current ergotamine:  none Current anti-emetic:  Zofran 4mg  Current muscle relaxants:  none Current Antihypertensive medications:  none Current Antidepressant medications:  nortriptyline 25mg  QHS Current Anticonvulsant medications:  none Current anti-CGRP:  Nurtec PRN Current Vitamins/Herbal/Supplements:  none Current Antihistamines/Decongestants:   Flonase Other therapy:  Vick's vapor rub, tight hat Hormone/birth control:  none     Caffeine:  1-2 large cups of coffee daily Alcohol:  2 times a month (social) Smoker:  no Diet: water throughout the day, eats healthy, sometimes skips meals (breakfast) Exercise:  tries to go to gym three times a week Depression:  ok; Anxiety:  ok Other pain:  no Sleep hygiene:  ok  HISTORY:  Onset:  mid-20s, when she stopped birth control (Mirena) Location:  left frontal region gradually spreads to left face, ear pain Quality:  squeezing Intensity:  severe.   Aura:  absent Prodrome:  change in mood (irritable), decreased appetite Associated symptoms:  Nausea.  She denies associated vomiting, phonophobia, visual disturbance unilateral numbness or weakness. Duration:  4 days (starting 2 days before cycle - first 2 days severe, last 2 days lingering) Frequency:  Usually starts 2 days before cycle, initially once every 3 months, since June 2023, they have been once a month. Frequency of abortive medication: 4 days a month Triggers:  menstrual cycle, maybe change in weather Relieving factors:  Vick's vapor rub to the forehead, and wears a tight hat, hydration Activity:  aggravates.  Cannot function NOTE:  September has been more unpredictable.  Her migraine started 5 days before start of her cycle and lasted through her entire cycle.  She also had a migraine triggered by an alcoholic beverage.   Past NSAIDS/analgesics:  Excedrin Migraine, Tylenol, Aleve, Goody powder Past abortive triptans:  rizatriptan Past abortive ergotamine:  none Past muscle relaxants:  tizanidine Past anti-emetic:  none Past antihypertensive  medications:  none Past antidepressant medications:  none Past anticonvulsant medications:  topiramate (side effects) Past anti-CGRP:  none Past vitamins/Herbal/Supplements:  none Past antihistamines/decongestants:  none Other past therapies:  none    Family history of headache:   no  PAST MEDICAL HISTORY: Past Medical History:  Diagnosis Date   Calculus of kidney    Chlamydia    Polycystic ovaries     MEDICATIONS: Current Outpatient Medications on File Prior to Visit  Medication Sig Dispense Refill   azithromycin (ZITHROMAX) 500 MG tablet Take 1 tablet (500 mg total) by mouth daily. 7 tablet 0   CLOMID 50 MG tablet Take by mouth. (Patient not taking: Reported on 03/07/2023)     doxycycline (VIBRA-TABS) 100 MG tablet Take 1 tablet (100 mg total) by mouth 2 (two) times daily. 14 tablet 0   fluticasone (FLONASE) 50 MCG/ACT nasal spray Place 1 spray into both nostrils daily. 16 g 2   gabapentin (NEURONTIN) 600 MG tablet Take 1 tablet (600 mg total) by mouth at bedtime. 30 tablet 0   lactulose (CHRONULAC) 10 GM/15ML solution Take 30 mLs (20 g total) by mouth daily as needed for mild constipation. (Patient not taking: Reported on 02/27/2023) 120 mL 0   nortriptyline (PAMELOR) 25 MG capsule Take 1 capsule (25 mg total) by mouth at bedtime. (Patient not taking: Reported on 02/27/2023) 30 capsule 3   ondansetron (ZOFRAN) 4 MG tablet Take 1 tablet (4 mg total) by mouth every 8 (eight) hours as needed for nausea or vomiting. 20 tablet 5   predniSONE (DELTASONE) 50 MG tablet Take 1 tablet (50 mg total) by mouth daily with breakfast. 5 tablet 0   Rimegepant Sulfate (NURTEC) 75 MG TBDP Take 1 tablet (75 mg total) by mouth daily as needed. 8 tablet 11   SUMAtriptan (IMITREX) 100 MG tablet Take 1 tablet (100 mg total) by mouth once as needed for up to 1 dose for migraine. May repeat in 2 hours if headache persists or recurs. 10 tablet 5   No current facility-administered medications on file prior to visit.    ALLERGIES: No Known Allergies  FAMILY HISTORY: Family History  Problem Relation Age of Onset   Hypertension Mother    Hypertension Father    Breast cancer Maternal Aunt        not sure of age      Objective:  Blood pressure 123/89, pulse (!) 104, height 5\' 1"   (1.549 m), weight 227 lb 3.2 oz (103.1 kg), last menstrual period 01/28/2023, SpO2 97%. General: No acute distress.  Patient appears well-groomed.   Head:  Normocephalic/atraumatic Neck:  Supple.  No paraspinal tenderness.  Full range of motion. Heart:  Regular rate and rhythm. Neuro:  Alert and oriented.  Speech fluent and not dysarthric.  Language intact.  CN II-XII intact.  Bulk and tone normal.  Muscle strength 5/5 throughout.  Deep tendon reflexes 2+ throughout.  Gait normal.  Romberg negative.  Nancy Millet, DO  CC: Nancy Norton, FNP

## 2023-04-05 ENCOUNTER — Ambulatory Visit: Payer: BC Managed Care – PPO | Admitting: Neurology

## 2023-04-05 ENCOUNTER — Encounter: Payer: Self-pay | Admitting: Neurology

## 2023-04-05 VITALS — BP 123/89 | HR 104 | Ht 61.0 in | Wt 227.2 lb

## 2023-04-05 DIAGNOSIS — G43011 Migraine without aura, intractable, with status migrainosus: Secondary | ICD-10-CM | POA: Diagnosis not present

## 2023-04-05 MED ORDER — PREDNISONE 10 MG PO TABS: ORAL_TABLET | ORAL | 0 refills | Status: DC

## 2023-04-05 NOTE — Patient Instructions (Signed)
Continue nortriptyline 25mg  at bedtime.  If no improvement in migraines by time for refill, contact me and we can increase dose Take sumatriptan as needed. When you are in [place] and you get a migraine, just go ahead and take the prednisone taper as prescribed. Limit use of pain relievers to no more than 2 days out of week to prevent risk of rebound or medication-overuse headache. Keep headache diary Follow up 6 months.

## 2023-04-11 ENCOUNTER — Other Ambulatory Visit: Payer: Self-pay | Admitting: Family Medicine

## 2023-06-10 ENCOUNTER — Other Ambulatory Visit: Payer: Self-pay | Admitting: Neurology

## 2023-06-10 DIAGNOSIS — G43109 Migraine with aura, not intractable, without status migrainosus: Secondary | ICD-10-CM

## 2023-06-10 DIAGNOSIS — R110 Nausea: Secondary | ICD-10-CM

## 2023-06-10 DIAGNOSIS — H53149 Visual discomfort, unspecified: Secondary | ICD-10-CM

## 2023-07-15 ENCOUNTER — Encounter: Payer: Self-pay | Admitting: Physician Assistant

## 2023-07-15 ENCOUNTER — Ambulatory Visit: Payer: 59 | Admitting: Physician Assistant

## 2023-07-15 VITALS — BP 111/76 | HR 79 | Resp 16 | Ht 61.0 in | Wt 221.3 lb

## 2023-07-15 DIAGNOSIS — R112 Nausea with vomiting, unspecified: Secondary | ICD-10-CM | POA: Diagnosis not present

## 2023-07-15 DIAGNOSIS — H1031 Unspecified acute conjunctivitis, right eye: Secondary | ICD-10-CM | POA: Diagnosis not present

## 2023-07-15 DIAGNOSIS — L03213 Periorbital cellulitis: Secondary | ICD-10-CM | POA: Diagnosis not present

## 2023-07-15 MED ORDER — FLUCONAZOLE 150 MG PO TABS: 150.0000 mg | ORAL_TABLET | Freq: Once | ORAL | 0 refills | Status: AC

## 2023-07-15 MED ORDER — POLYMYXIN B-TRIMETHOPRIM 10000-0.1 UNIT/ML-% OP SOLN: 1.0000 [drp] | OPHTHALMIC | 0 refills | Status: AC

## 2023-07-15 MED ORDER — AMOXICILLIN 875 MG PO TABS: 875.0000 mg | ORAL_TABLET | Freq: Two times a day (BID) | ORAL | 0 refills | Status: AC

## 2023-07-15 MED ORDER — ONDANSETRON HCL 4 MG PO TABS: 4.0000 mg | ORAL_TABLET | Freq: Three times a day (TID) | ORAL | 5 refills | Status: DC | PRN

## 2023-07-16 NOTE — Progress Notes (Signed)
 Established patient visit  Patient: Nancy Ramos   DOB: [birth year]   32 y.o. Female  MRN: [id number] Visit Date: 07/15/2023  Today's healthcare provider: Jolynn Spencer, PA-C   Chief Complaint  Patient presents with   Conjunctivitis   Subjective     HPI     Conjunctivitis   In right eye.  Characterized as red blood on eye.  Associated symptoms include eye pain, irritation, discharge and lid swelling.  Pain was noted as 6/10.  Occurring constantly.  It is worse throughout the day.  Duration of days.  Since onset it is gradually worsening.  Treatments tried include artificial tears (Cold compresses).  Response to treatment was no improvement.      Last edited by Rosas, Joseline E, CMA on 07/15/2023  1:32 PM.       Discussed the use of AI scribe software for clinical note transcription with the patient, who gave verbal consent to proceed.  History of Present Illness   The patient presents with right eye pain and swelling that started on Friday. She woke up with the discomfort and it has progressively worsened, with the eye becoming puffy on Saturday and significantly swollen by Sunday. The patient describes the sensation as heavy and painful, making it difficult to open the eye. She also reports small bumps in the area that are tender to touch. The patient has a history of recurrent conjunctivitis, but states that this episode is worse than previous ones. She typically manages the condition with eye drops and cold compresses.  The patient also reports a recent illness, suspected to be norovirus, which occurred two weeks prior. Symptoms included vomiting, stomach pain, diarrhea, fever, and body chills. She has since recovered from this illness.  In addition to the eye symptoms, the patient requests a refill of Zofran , which she takes for nausea associated with her migraine medication. She reports having migraines approximately once a week, typically during her menstrual cycle.            07/15/2023    1:34 PM 02/27/2023    8:36 AM 05/21/2022    2:21 PM  Depression screen PHQ 2/9  Decreased Interest 0 0 0  Down, Depressed, Hopeless 0 0 0  PHQ - 2 Score 0 0 0  Altered sleeping  1 0  Tired, decreased energy  1 0  Change in appetite  0 0  Feeling bad or failure about yourself   0 0  Trouble concentrating  0 0  Moving slowly or fidgety/restless  0 0  Suicidal thoughts  0 0  PHQ-9 Score  2 0  Difficult doing work/chores   Not difficult at all      07/15/2023    1:34 PM 02/27/2023    8:36 AM  GAD 7 : Generalized Anxiety Score  Nervous, Anxious, on Edge 0 1  Control/stop worrying 0 0  Worry too much - different things 0 0  Trouble relaxing 0 0  Restless 0 0  Easily annoyed or irritable 0 0  Afraid - awful might happen 0 1  Total GAD 7 Score 0 2  Anxiety Difficulty Not difficult at all     Medications: Outpatient Medications Prior to Visit  Medication Sig   fluticasone  (FLONASE ) 50 MCG/ACT nasal spray Place 1 spray into both nostrils daily.   gabapentin  (NEURONTIN ) 600 MG tablet TAKE 1 TABLET(600 MG) BY MOUTH AT BEDTIME   nortriptyline  (PAMELOR ) 25 MG capsule Take 1 capsule (25 mg total) by mouth at bedtime.  SUMAtriptan  (IMITREX ) 100 MG tablet Take 1 tablet (100 mg total) by mouth once as needed for up to 1 dose for migraine. May repeat in 2 hours if headache persists or recurs.   [DISCONTINUED] ondansetron  (ZOFRAN ) 4 MG tablet Take 1 tablet (4 mg total) by mouth every 8 (eight) hours as needed for nausea or vomiting.   CLOMID 50 MG tablet Take by mouth. (Patient not taking: Reported on 03/07/2023)   lactulose  (CHRONULAC ) 10 GM/15ML solution Take 30 mLs (20 g total) by mouth daily as needed for mild constipation. (Patient not taking: Reported on 02/27/2023)   predniSONE  (DELTASONE ) 10 MG tablet Take 60mg  on day 1, then 50mg  on day 2, then 40mg  on day 3, then 30mg  on day 4, then 20mg  on day 5, then 10mg  on day 6, then STOP   Rimegepant Sulfate (NURTEC) 75 MG  TBDP Take 1 tablet (75 mg total) by mouth daily as needed.   No facility-administered medications prior to visit.    Review of Systems All negative Except see HPI       Objective    BP 111/76 (BP Location: Right Arm, Patient Position: Sitting, Cuff Size: Large)   Pulse 79   Resp 16   Ht 5' 1 (1.549 m)   Wt 221 lb 4.8 oz (100.4 kg)   BMI 41.81 kg/m     Physical Exam Vitals reviewed.  Constitutional:      General: She is not in acute distress.    Appearance: Normal appearance. She is well-developed. She is not diaphoretic.  HENT:     Head: Normocephalic and atraumatic.     Right Ear: Ear canal and external ear normal.     Left Ear: Ear canal and external ear normal.     Nose: Congestion and rhinorrhea present.  Eyes:     General: No scleral icterus.       Right eye: Discharge present.        Left eye: No discharge.     Extraocular Movements: Extraocular movements intact.     Conjunctiva/sclera: Conjunctivae normal.     Pupils: Pupils are equal, round, and reactive to light.     Comments: Periorbital swelling and redness/right  Neck:     Thyroid : No thyromegaly.  Cardiovascular:     Rate and Rhythm: Normal rate and regular rhythm.     Pulses: Normal pulses.     Heart sounds: Normal heart sounds. No murmur heard. Pulmonary:     Effort: Pulmonary effort is normal. No respiratory distress.     Breath sounds: Normal breath sounds. No wheezing, rhonchi or rales.  Musculoskeletal:     Cervical back: Neck supple.     Right lower leg: No edema.     Left lower leg: No edema.  Lymphadenopathy:     Cervical: No cervical adenopathy.  Skin:    General: Skin is warm and dry.     Findings: No rash.  Neurological:     Mental Status: She is alert and oriented to person, place, and time. Mental status is at baseline.  Psychiatric:        Mood and Affect: Mood normal.        Behavior: Behavior normal.      No results found for any visits on 07/15/23.      Assessment  and Plan    Conjunctivitis Periorbital cellulitis of right eye Right eye pain, swelling, and discharge since Friday. No visual changes. History of recurrent conjunctivitis. Recent history of flu-like symptoms. -  Prescribe antibiotic eye drops for 10 days. -Advise warm compresses and careful cleaning of the eye. -Check for coverage of prescribed medication and adjust if necessary. -Return to work on 07/17/2023 if symptoms improve. - amoxicillin  (AMOXIL ) 875 MG tablet; Take 1 tablet (875 mg total) by mouth 2 (two) times daily for 10 days.  Dispense: 20 tablet; Refill: 0 - trimethoprim -polymyxin b  (POLYTRIM ) ophthalmic solution; Place 1 drop into both eyes every 4 (four) hours for 7 days.  Dispense: 10 mL; Refill: 0 - fluconazole  (DIFLUCAN ) 150 MG tablet; Take 1 tablet (150 mg total) by mouth once for 1 dose.  Dispense: 1 tablet; Refill: 0  Nausea and vomiting, unspecified vomiting type - ondansetron  (ZOFRAN ) 4 MG tablet; Take 1 tablet (4 mg total) by mouth every 8 (eight) hours as needed for nausea or vomiting.  Dispense: 20 tablet; Refill: 5 Nausea with Migraine Medication Reports nausea when taking migraine medication. -Prescribe Zofran  for nausea as needed.  Follow-up Reassess symptoms in 2 days. If symptoms persist, extend sick leave and reassess treatment plan.     No orders of the defined types were placed in this encounter.   No follow-ups on file.   The patient was advised to call back or seek an in-person evaluation if the symptoms worsen or if the condition fails to improve as anticipated.  I discussed the assessment and treatment plan with the patient. The patient was provided an opportunity to ask questions and all were answered. The patient agreed with the plan and demonstrated an understanding of the instructions.  I, Ronda Rajkumar, PA-C have reviewed all documentation for this visit. The documentation on 07/15/2023  for the exam, diagnosis, procedures, and orders are all  accurate and complete.  Jolynn Spencer, [facility], MMS [facility] [phone] (phone) [phone] (fax)  [facility]

## 2023-10-14 ENCOUNTER — Ambulatory Visit: Payer: BC Managed Care – PPO | Admitting: Neurology

## 2023-12-05 NOTE — Progress Notes (Signed)
 Established patient visit  Patient: Nancy Ramos   DOB: 1990-10-08   32 y.o. Female  MRN: [id number] Visit Date: 12/06/2023  Today's healthcare provider: Blane Bunting, PA-C   Chief Complaint  Patient presents with   Weight Loss    Wt loss meds.. No other conccerns [facility])   Subjective     Discussed the use of AI scribe software for clinical note transcription with the patient, who gave verbal consent to proceed.  History of Present Illness Nancy Ramos is a 33 year old female who presents for weight management.  She engages in regular physical activity, attending the gym four to five times weekly and walking during her daughter's softball practice. Her weight remains between 218 to 223 pounds, indicating a plateau despite these efforts.  She has made dietary modifications, reducing bread, eliminating sodas and juices, and increasing tea and vitamin intake. She uses a Fitbit and a nutrition app to monitor water and micronutrient intake. She is involved in a six-week gym challenge focused on micronutrition.  She manages PCOS through diet and exercise, with noted improvements in her menstrual cycle since resuming gym activities. She experiences chronic migraines but manages them effectively.  She experiences high stress levels due to her upcoming wedding and recent personal events. The gym serves as a stress relief outlet. She has a history of elevated blood pressure, which she associates with stress and family history.       07/15/2023    1:34 PM 02/27/2023    8:36 AM 05/21/2022    2:21 PM  Depression screen PHQ 2/9  Decreased Interest 0 0 0  Down, Depressed, Hopeless 0 0 0  PHQ - 2 Score 0 0 0  Altered sleeping  1 0  Tired, decreased energy  1 0  Change in appetite  0 0  Feeling bad or failure about yourself   0 0  Trouble concentrating  0 0  Moving slowly or fidgety/restless  0 0  Suicidal thoughts  0 0  PHQ-9 Score  2 0  Difficult doing work/chores   Not  difficult at all      07/15/2023    1:34 PM 02/27/2023    8:36 AM  GAD 7 : Generalized Anxiety Score  Nervous, Anxious, on Edge 0 1  Control/stop worrying 0 0  Worry too much - different things 0 0  Trouble relaxing 0 0  Restless 0 0  Easily annoyed or irritable 0 0  Afraid - awful might happen 0 1  Total GAD 7 Score 0 2  Anxiety Difficulty Not difficult at all     Medications: Outpatient Medications Prior to Visit  Medication Sig   fluticasone  (FLONASE ) 50 MCG/ACT nasal spray Place 1 spray into both nostrils daily.   gabapentin  (NEURONTIN ) 600 MG tablet TAKE 1 TABLET(600 MG) BY MOUTH AT BEDTIME   nortriptyline  (PAMELOR ) 25 MG capsule Take 1 capsule (25 mg total) by mouth at bedtime.   ondansetron  (ZOFRAN ) 4 MG tablet Take 1 tablet (4 mg total) by mouth every 8 (eight) hours as needed for nausea or vomiting.   SUMAtriptan  (IMITREX ) 100 MG tablet Take 1 tablet (100 mg total) by mouth once as needed for up to 1 dose for migraine. May repeat in 2 hours if headache persists or recurs.   No facility-administered medications prior to visit.    Review of Systems All negative Except see HPI       Objective    BP (!) 117/94 (BP Location: Right  Arm, Patient Position: Sitting, Cuff Size: Normal)   Pulse 79   Resp 16   Ht 5\' 1"  (1.549 m)   Wt 221 lb (100.2 kg)   SpO2 100%   BMI 41.76 kg/m     Physical Exam Vitals reviewed.  Constitutional:      General: She is not in acute distress.    Appearance: Normal appearance. She is well-developed. She is not diaphoretic.  HENT:     Head: Normocephalic and atraumatic.  Eyes:     General: No scleral icterus.    Conjunctiva/sclera: Conjunctivae normal.  Neck:     Thyroid: No thyromegaly.  Cardiovascular:     Rate and Rhythm: Normal rate and regular rhythm.     Pulses: Normal pulses.     Heart sounds: Normal heart sounds. No murmur heard. Pulmonary:     Effort: Pulmonary effort is normal. No respiratory distress.     Breath  sounds: Normal breath sounds. No wheezing, rhonchi or rales.  Musculoskeletal:     Cervical back: Neck supple.     Right lower leg: No edema.     Left lower leg: No edema.  Lymphadenopathy:     Cervical: No cervical adenopathy.  Skin:    General: Skin is warm and dry.     Findings: No rash.  Neurological:     Mental Status: She is alert and oriented to person, place, and time. Mental status is at baseline.  Psychiatric:        Mood and Affect: Mood normal.        Behavior: Behavior normal.      No results found for any visits on 12/06/23.      Assessment & Plan Obesity Chronic Weight stable at 218-223 lbs despite exercise and dietary changes including multiple applications and program for weight loss x 1 year. No thyroid issues. Discussed Wegovy  as phentermine contraindicated due to elevated blood pressure, possible HTN.  Prior authorization for Wegovy  needed. Discussed monitoring for thyroid-related side effects. - Order blood work including thyroid function tests. - Consider Wegovy  pending blood work results and prior authorization. - Continue current exercise and dietary regimen.  Thyroid Enlargement (Goiter) Possible thyroid enlargement noted. No thyroid cancer history. Ultrasound needed to confirm goiter before Wegovy  due to contraindications. - Order thyroid ultrasound to assess for goiter.  Elevated blood pressure reading Slightly elevated blood pressure- first reading, second reading was 127/82, possibly stress-related. Advised home monitoring to confirm hypertension. Discussed potential medication need. - Monitor blood pressure at home twice daily for two weeks. - Schedule follow-up appointment in two weeks to review blood pressure readings or in a month.  Chronic Migraine Well-managed, no acute issues. Continue sumatriptan  for flare up  Polycystic Ovary Syndrome (PCOS) Chronic Managed with diet and exercise. Regular menstrual cycles with improvement  noted.  General Health Maintenance Maintaining health through exercise, diet, and monitoring. Managing wedding and family stress. - Encourage continued healthy lifestyle choices including exercise and balanced diet. - Discuss stress management techniques.  Morbid obesity (HCC) (Primary) - TSH - Semaglutide -Weight Management 0.25 MG/0.5ML SOAJ; Inject 0.25 mg into the skin once a week for 28 days.  Dispense: 2 mL; Refill: 0 - Semaglutide -Weight Management 0.5 MG/0.5ML SOAJ; Inject 0.5 mg into the skin once a week for 28 days.  Dispense: 2 mL; Refill: 1 - Hemoglobin A1c - Comprehensive metabolic panel with GFR - CBC with Differential/Platelet - Lipid panel  Vitamin D deficiency In the past - VITAMIN D 25 Hydroxy (Vit-D Deficiency, Fractures)  Goiter - US  THYROID; Future  No orders of the defined types were placed in this encounter.   No follow-ups on file.   The patient was advised to call back or seek an in-person evaluation if the symptoms worsen or if the condition fails to improve as anticipated.  I discussed the assessment and treatment plan with the patient. The patient was provided an opportunity to ask questions and all were answered. The patient agreed with the plan and demonstrated an understanding of the instructions.  I, Bellamarie Pflug, PA-C have reviewed all documentation for this visit. The documentation on 12/06/2023  for the exam, diagnosis, procedures, and orders are all accurate and complete.  Blane Bunting, [facility], MMS [facility] [phone] (phone) [phone] (fax)  [facility]

## 2023-12-06 ENCOUNTER — Ambulatory Visit: Payer: Self-pay | Admitting: Physician Assistant

## 2023-12-06 ENCOUNTER — Encounter: Payer: Self-pay | Admitting: Physician Assistant

## 2023-12-06 DIAGNOSIS — E049 Nontoxic goiter, unspecified: Secondary | ICD-10-CM

## 2023-12-06 DIAGNOSIS — E559 Vitamin D deficiency, unspecified: Secondary | ICD-10-CM

## 2023-12-06 DIAGNOSIS — R030 Elevated blood-pressure reading, without diagnosis of hypertension: Secondary | ICD-10-CM

## 2023-12-06 DIAGNOSIS — E282 Polycystic ovarian syndrome: Secondary | ICD-10-CM

## 2023-12-06 DIAGNOSIS — G43709 Chronic migraine without aura, not intractable, without status migrainosus: Secondary | ICD-10-CM

## 2023-12-06 MED ORDER — SEMAGLUTIDE-WEIGHT MANAGEMENT 0.5 MG/0.5ML ~~LOC~~ SOAJ: 0.5000 mg | SUBCUTANEOUS | 1 refills | Status: AC

## 2023-12-06 MED ORDER — SEMAGLUTIDE-WEIGHT MANAGEMENT 0.25 MG/0.5ML ~~LOC~~ SOAJ: 0.2500 mg | SUBCUTANEOUS | 0 refills | Status: DC

## 2023-12-07 DIAGNOSIS — E282 Polycystic ovarian syndrome: Secondary | ICD-10-CM | POA: Insufficient documentation

## 2023-12-07 DIAGNOSIS — E049 Nontoxic goiter, unspecified: Secondary | ICD-10-CM | POA: Insufficient documentation

## 2023-12-07 DIAGNOSIS — R030 Elevated blood-pressure reading, without diagnosis of hypertension: Secondary | ICD-10-CM | POA: Insufficient documentation

## 2023-12-07 LAB — COMPREHENSIVE METABOLIC PANEL WITH GFR
ALT: 8 IU/L (ref 0–32)
AST: 19 IU/L (ref 0–40)
Albumin: 4.2 g/dL (ref 3.9–4.9)
Alkaline Phosphatase: 79 IU/L (ref 44–121)
BUN/Creatinine Ratio: 14 (ref 9–23)
BUN: 10 mg/dL (ref 6–20)
Bilirubin Total: 0.3 mg/dL (ref 0.0–1.2)
CO2: 22 mmol/L (ref 20–29)
Calcium: 9.2 mg/dL (ref 8.7–10.2)
Chloride: 100 mmol/L (ref 96–106)
Creatinine, Ser: 0.69 mg/dL (ref 0.57–1.00)
Globulin, Total: 2.8 g/dL (ref 1.5–4.5)
Glucose: 93 mg/dL (ref 70–99)
Potassium: 4 mmol/L (ref 3.5–5.2)
Sodium: 136 mmol/L (ref 134–144)
Total Protein: 7 g/dL (ref 6.0–8.5)
eGFR: 119 mL/min/{1.73_m2} (ref >59)

## 2023-12-07 LAB — CBC WITH DIFFERENTIAL/PLATELET
Basophils Absolute: 0 10*3/uL (ref 0.0–0.2)
Basos: 1 %
EOS (ABSOLUTE): 0.1 10*3/uL (ref 0.0–0.4)
Eos: 1 %
Hematocrit: 39.4 % (ref 34.0–46.6)
Hemoglobin: 12.5 g/dL (ref 11.1–15.9)
Immature Grans (Abs): 0 10*3/uL (ref 0.0–0.1)
Immature Granulocytes: 0 %
Lymphocytes Absolute: 2.4 10*3/uL (ref 0.7–3.1)
Lymphs: 34 %
MCH: 28.3 pg (ref 26.6–33.0)
MCHC: 31.7 g/dL (ref 31.5–35.7)
MCV: 89 fL (ref 79–97)
Monocytes Absolute: 0.6 10*3/uL (ref 0.1–0.9)
Monocytes: 9 %
Neutrophils Absolute: 3.8 10*3/uL (ref 1.4–7.0)
Neutrophils: 55 %
Platelets: 212 10*3/uL (ref 150–450)
RBC: 4.41 x10E6/uL (ref 3.77–5.28)
RDW: 12.4 % (ref 11.7–15.4)
WBC: 6.9 10*3/uL (ref 3.4–10.8)

## 2023-12-07 LAB — TSH: TSH: 1.05 u[IU]/mL (ref 0.450–4.500)

## 2023-12-07 LAB — LIPID PANEL
Chol/HDL Ratio: 3.2 ratio (ref 0.0–4.4)
Cholesterol, Total: 155 mg/dL (ref 100–199)
HDL: 49 mg/dL (ref >39)
LDL Chol Calc (NIH): 79 mg/dL (ref 0–99)
Triglycerides: 155 mg/dL — ABNORMAL HIGH (ref 0–149)
VLDL Cholesterol Cal: 27 mg/dL (ref 5–40)

## 2023-12-07 LAB — HEMOGLOBIN A1C
Est. average glucose Bld gHb Est-mCnc: 105 mg/dL
Hgb A1c MFr Bld: 5.3 % (ref 4.8–5.6)

## 2023-12-09 ENCOUNTER — Ambulatory Visit: Payer: Self-pay | Admitting: Physician Assistant

## 2023-12-13 ENCOUNTER — Ambulatory Visit
Admission: RE | Admit: 2023-12-13 | Discharge: 2023-12-13 | Disposition: A | Discharge status: Home / Self Care | Source: Ambulatory Visit | Attending: Physician Assistant | Admitting: Physician Assistant

## 2023-12-13 DIAGNOSIS — E049 Nontoxic goiter, unspecified: Secondary | ICD-10-CM | POA: Insufficient documentation

## 2023-12-19 NOTE — Progress Notes (Signed)
 Established patient visit  Patient: Nancy Ramos   DOB: Mar 09, 1991   32 y.o. Female  MRN: [id number] Visit Date: 12/20/2023  Today's healthcare provider: Blane Bunting, PA-C   No chief complaint on file.  Subjective       Discussed the use of AI scribe software for clinical note transcription with the patient, who gave verbal consent to proceed.  History of Present Illness Nancy Ramos is a 33 year old female with hypertension and migraines who presents for follow-up on her blood pressure and migraine management.  Her blood pressure readings at home remain stable with the use of a home monitoring machine. She incorporates hibiscus tea into her regimen and manages her diet by reducing unhealthy food intake, drinking plenty of water, and avoiding salty foods.  Her migraines are well-managed for acute attacks, and she is considering new medication options for prevention of chronic migraines, which she would take every other day. She has not yet started this new treatment.  She experiences ear pain, described as a mild ache while driving, with fluid noted behind the tympanic membranes. She has Flonase  available but avoids regular antihistamine use due to drowsiness. No ear infection is present.       12/06/2023    1:19 PM 07/15/2023    1:34 PM 02/27/2023    8:36 AM  Depression screen PHQ 2/9  Decreased Interest 0 0 0  Down, Depressed, Hopeless 0 0 0  PHQ - 2 Score 0 0 0  Altered sleeping 0  1  Tired, decreased energy 0  1  Change in appetite 0  0  Feeling bad or failure about yourself  0  0  Trouble concentrating 0  0  Moving slowly or fidgety/restless 0  0  Suicidal thoughts 0  0  PHQ-9 Score 0  2  Difficult doing work/chores Not difficult at all        12/06/2023    1:19 PM 07/15/2023    1:34 PM 02/27/2023    8:36 AM  GAD 7 : Generalized Anxiety Score  Nervous, Anxious, on Edge 0 0 1  Control/stop worrying 0 0 0  Worry too much - different things 0 0 0   Trouble relaxing 0 0 0  Restless 0 0 0  Easily annoyed or irritable 0 0 0  Afraid - awful might happen 0 0 1  Total GAD 7 Score 0 0 2  Anxiety Difficulty Not difficult at all Not difficult at all     Medications: Outpatient Medications Prior to Visit  Medication Sig   fluticasone  (FLONASE ) 50 MCG/ACT nasal spray Place 1 spray into both nostrils daily.   gabapentin  (NEURONTIN ) 600 MG tablet TAKE 1 TABLET(600 MG) BY MOUTH AT BEDTIME   nortriptyline  (PAMELOR ) 25 MG capsule Take 1 capsule (25 mg total) by mouth at bedtime.   ondansetron  (ZOFRAN ) 4 MG tablet Take 1 tablet (4 mg total) by mouth every 8 (eight) hours as needed for nausea or vomiting.   Semaglutide -Weight Management 0.25 MG/0.5ML SOAJ Inject 0.25 mg into the skin once a week for 28 days.   [START ON 01/04/2024] Semaglutide -Weight Management 0.5 MG/0.5ML SOAJ Inject 0.5 mg into the skin once a week for 28 days.   SUMAtriptan  (IMITREX ) 100 MG tablet Take 1 tablet (100 mg total) by mouth once as needed for up to 1 dose for migraine. May repeat in 2 hours if headache persists or recurs.   No facility-administered medications prior to visit.    Review of Systems  All other systems reviewed and are negative. All negative Except see HPI       Objective    There were no vitals taken for this visit.    Physical Exam Vitals reviewed.  Constitutional:      General: She is not in acute distress.    Appearance: Normal appearance. She is well-developed and normal weight. She is not diaphoretic.  HENT:     Head: Normocephalic and atraumatic.     Right Ear: Ear canal and external ear normal.     Left Ear: Ear canal and external ear normal.     Ears:     Comments: Fluids behind the tms, L>R    Nose: Congestion and rhinorrhea present.     Mouth/Throat:     Pharynx: No posterior oropharyngeal erythema.     Comments: Postnasal drainage noted  Eyes:     General: No scleral icterus.       Right eye: No discharge.        Left  eye: No discharge.     Extraocular Movements: Extraocular movements intact.     Conjunctiva/sclera: Conjunctivae normal.     Pupils: Pupils are equal, round, and reactive to light.   Neck:     Thyroid: No thyromegaly.   Cardiovascular:     Rate and Rhythm: Normal rate and regular rhythm.     Pulses: Normal pulses.     Heart sounds: Normal heart sounds. No murmur heard. Pulmonary:     Effort: Pulmonary effort is normal. No respiratory distress.     Breath sounds: Normal breath sounds. No wheezing, rhonchi or rales.  Abdominal:     General: Abdomen is flat. Bowel sounds are normal.     Palpations: Abdomen is soft.   Musculoskeletal:     Cervical back: Neck supple.     Right lower leg: No edema.     Left lower leg: No edema.  Lymphadenopathy:     Cervical: No cervical adenopathy.   Skin:    General: Skin is warm and dry.     Findings: No rash.   Neurological:     Mental Status: She is alert and oriented to person, place, and time. Mental status is at baseline.   Psychiatric:        Mood and Affect: Mood normal.        Behavior: Behavior normal.      No results found for any visits on 12/20/23.      Assessment and Plan Assessment & Plan Chronic Migraine Chronic migraines managed. New medication option available, promising results, insurance-covered. - Check availability of medication samples. - Prescribe new migraine medication if samples unavailable. - Educate on taking the medication every other day.  Elevated BP reading Blood pressure well-managed, likely stress-related. No changes needed. - Continue monitoring blood pressure at home. - Encourage stress management techniques. - Advise to avoid salty foods and stay hydrated.  Thyroid Nodules Thyroid ultrasound shows small benign nodules. No biopsy or immediate follow-up needed. - Monitor thyroid nodules periodically.  Eustachian Tube Dysfunction Intermittent ear pain due to fluid, not infection. Likely  environmental factors involved. - Start antihistamines such as Zyrtec. - Use Flonase  nasal spray. - Avoid sleeping directly under a fan. - Encourage swallowing exercises to normalize ear pressure: Swallowing intermittently due to pressure changes to help equalize ear pressure.  Swallowing while pinching the nose can facilitate eustachian tube opening. Pinching the nose, placing the tongue on the roof of the mouth, and gently moving the  back of the tongue upward can help as well  General Health Maintenance Emphasized daily relaxation and meditation for stress management. - Encourage daily stress management practices such as meditation or relaxation exercises.  Follow-up Ongoing monitoring of prior authorization for Wegovy . - Check on prior authorization status for Wegovy . - She will inform if any updates on prior authorization.  Morbid obesity (HCC) (Primary) Chronic Body mass index is 41.95 kg/m. Weight continues to stay stable despite exercise and dietary changes Pending approval for reported Establish baseline for thyroid good morning and thyroid gland Continue current exercise and dietary changes Will follow-up  Chronic migraine without aura without status migrainosus, not intractable  - Rimegepant Sulfate (NURTEC) 75 MG TBDP; 75 mg orally once every other day placed on or under the tongue  Dispense: 30 tablet; Refill: 1   Vitamin D deficiency In the past We will need to recheck as a follow-up   No orders of the defined types were placed in this encounter.   No follow-ups on file.   The patient was advised to call back or seek an in-person evaluation if the symptoms worsen or if the condition fails to improve as anticipated.  I discussed the assessment and treatment plan with the patient. The patient was provided an opportunity to ask questions and all were answered. The patient agreed with the plan and demonstrated an understanding of the instructions.  I, Gagan Dillion, PA-C have reviewed all documentation for this visit. The documentation on 12/20/2023  for the exam, diagnosis, procedures, and orders are all accurate and complete.  Blane Bunting, [facility], MMS [facility] [phone] (phone) [phone] (fax)  [facility]

## 2023-12-20 ENCOUNTER — Ambulatory Visit: Admitting: Physician Assistant

## 2023-12-20 ENCOUNTER — Encounter: Payer: Self-pay | Admitting: Physician Assistant

## 2023-12-20 ENCOUNTER — Other Ambulatory Visit: Payer: Self-pay

## 2023-12-20 DIAGNOSIS — H6993 Unspecified Eustachian tube disorder, bilateral: Secondary | ICD-10-CM

## 2023-12-20 DIAGNOSIS — G43709 Chronic migraine without aura, not intractable, without status migrainosus: Secondary | ICD-10-CM

## 2023-12-20 DIAGNOSIS — E559 Vitamin D deficiency, unspecified: Secondary | ICD-10-CM

## 2023-12-20 DIAGNOSIS — R030 Elevated blood-pressure reading, without diagnosis of hypertension: Secondary | ICD-10-CM | POA: Diagnosis not present

## 2023-12-20 DIAGNOSIS — E042 Nontoxic multinodular goiter: Secondary | ICD-10-CM | POA: Diagnosis not present

## 2023-12-20 DIAGNOSIS — E282 Polycystic ovarian syndrome: Secondary | ICD-10-CM

## 2023-12-20 DIAGNOSIS — E049 Nontoxic goiter, unspecified: Secondary | ICD-10-CM

## 2023-12-20 MED ORDER — NURTEC 75 MG PO TBDP: ORAL_TABLET | ORAL | 1 refills | Status: DC

## 2023-12-20 MED ORDER — SEMAGLUTIDE-WEIGHT MANAGEMENT 0.25 MG/0.5ML ~~LOC~~ SOAJ: 0.2500 mg | SUBCUTANEOUS | 0 refills | Status: AC

## 2024-01-18 ENCOUNTER — Telehealth: Admitting: Nurse Practitioner

## 2024-01-18 DIAGNOSIS — R399 Unspecified symptoms and signs involving the genitourinary system: Secondary | ICD-10-CM | POA: Diagnosis not present

## 2024-01-18 DIAGNOSIS — B379 Candidiasis, unspecified: Secondary | ICD-10-CM | POA: Diagnosis not present

## 2024-01-18 DIAGNOSIS — T3695XA Adverse effect of unspecified systemic antibiotic, initial encounter: Secondary | ICD-10-CM | POA: Diagnosis not present

## 2024-01-18 MED ORDER — NITROFURANTOIN MONOHYD MACRO 100 MG PO CAPS: 100.0000 mg | ORAL_CAPSULE | Freq: Two times a day (BID) | ORAL | 0 refills | Status: AC

## 2024-01-18 MED ORDER — FLUCONAZOLE 150 MG PO TABS: 150.0000 mg | ORAL_TABLET | Freq: Every day | ORAL | 0 refills | Status: DC

## 2024-01-18 NOTE — Progress Notes (Signed)
 I have spent 5 minutes in review of e-visit questionnaire, review and updating patient chart, medical decision making and response to patient.   Claiborne Rigg, NP

## 2024-01-18 NOTE — Progress Notes (Signed)
 E-Visit for Urinary Problems  We are sorry that you are not feeling well.  Here is how we plan to help!  Based on what you shared with me it looks like you most likely have a simple urinary tract infection.  A UTI (Urinary Tract Infection) is a bacterial infection of the bladder.  Most cases of urinary tract infections are simple to treat but a key part of your care is to encourage you to drink plenty of fluids and watch your symptoms carefully.  I have prescribed MacroBid  100 mg twice a day for 5 days and a yeast pill.  Your symptoms should gradually improve. Call us  if the burning in your urine worsens, you develop worsening fever, back pain or pelvic pain or if your symptoms do not resolve after completing the antibiotic.  Urinary tract infections can be prevented by drinking plenty of water to keep your body hydrated.  Also be sure when you wipe, wipe from front to back and don't hold it in!  If possible, empty your bladder every 4 hours.  HOME CARE Drink plenty of fluids Compete the full course of the antibiotics even if the symptoms resolve Remember, when you need to go.go. Holding in your urine can increase the likelihood of getting a UTI! GET HELP RIGHT AWAY IF: You cannot urinate You get a high fever Worsening back pain occurs You see blood in your urine You feel sick to your stomach or throw up You feel like you are going to pass out  MAKE SURE YOU  Understand these instructions. Will watch your condition. Will get help right away if you are not doing well or get worse.   Thank you for choosing an e-visit.  Your e-visit answers were reviewed by a board certified advanced clinical practitioner to complete your personal care plan. Depending upon the condition, your plan could have included both over the counter or prescription medications.  Please review your pharmacy choice. Make sure the pharmacy is open so you can pick up prescription now. If there is a problem, you may  contact your provider through [employer] and have the prescription routed to another pharmacy.  Your safety is important to us . If you have drug allergies check your prescription carefully.   For the next 24 hours you can use MyChart to ask questions about today's visit, request a non-urgent call back, or ask for a work or school excuse. You will get an email in the next two days asking about your experience. I hope that your e-visit has been valuable and will speed your recovery.

## 2024-02-07 ENCOUNTER — Ambulatory Visit: Admitting: Physician Assistant

## 2024-02-21 NOTE — Progress Notes (Signed)
 [facility] Gynecology Problem Visit  CC:  Chief Complaint  Patient presents with  . Urinary Tract Infection    3x in a month, Pt on the last dose of cefdinir, still having symptoms      HPI: Nancy Ramos is a 33 y.o. female G1P1001 who presents for evaluation of burning, itching of labia. Started early July.   History of Present Illness Nancy Ramos is a 33 year old female who presents with pain and itching in the genital area.  She has experienced symptoms including burning with urination, itching, and pain three times in the past two months. Initially, she had a virtual visit without testing, followed by an urgent care visit where a urine culture was performed and returned negative. She completed a course of antibiotics, finishing today, but reports no improvement in symptoms.  Her symptoms began shortly after her wedding on July 5th, with a burning sensation during urination, itching, and pain localized to the labia. She describes the area as 'irritated' and notes that sitting can be uncomfortable, requiring her to adjust her position frequently. She has been sexually active with her husband, with no new partners or changes in sexual activity prior to symptom onset. She wore lace underwear for her wedding, which she does not usually wear, and has not been using any new products regularly. She has a history of yeast infections following antibiotic use, but has never had a yeast infection without a concurrent UTI. She took one dose of fluconazole  during this episode.  She reports a new symptom of clear, white discharge from the rectum over the past few days, which she describes as not being stool-related. She traveled to [place] recently and experienced constipation during the trip. Her husband and daughter, who also traveled, have not experienced similar symptoms.  Her menstrual cycle has been irregular recently, with a three-day period in June, no  period in July, and a five-day period in August. She attributes this irregularity to stress from her wedding. She is not using contraception and is open to pregnancy.   Got married July 5th.  Seen at PCPs office as a walk-in visit for acute UTI and dysuria.  Was treated empirically with cefdinir and fluconazole  (1 pill) . Urine culture sent and was negative. She still completed all the antibiotics. Today is last day on antibiotic.   Prior to that visit she was having the same symptoms and had a telehealth visit and was given Macrobid  for dysuria and suspected UTI.   Today, she denies vaginal discharge.  Reports having liquid fluid from rectum.  Described as clear, started recently.  Recently returned from a trip o [place] Drank pina colada. Returned 8 days ago.  No hx of abnormal pap smears.   Trying actively to get pregnant.  Patient's last menstrual period was 02/04/2024 (exact date). Flow for 9 days.  Periods are irregular (no period in July, June was 3 days),  Typically periods come monthly.   Review of Systems: Pertinent positive/negative documented in the HPI, all other systems reviewed and negative.     History reviewed in detail and updated: Past Medical History:  Past Medical History:  Diagnosis Date  . Chicken pox    vaccine    Past Surgical History:   Past Surgical History:  Procedure Laterality Date  . CESAREAN SECTION  September 16, 2012  . CHOLECYSTECTOMY  December 2011  . EXTRACTION TEETH     widsom teeth    Past Obstetric  History:  OB History     Gravida  1   Para  1   Term  1   Preterm      AB      Living  1      SAB      IAB      Ectopic      Molar      Multiple      Live Births  1         Past Family History:  family history includes Diabetes in her father; High blood pressure (Hypertension) in her father and mother; Kidney failure in her paternal grandfather; Liver disease in her paternal grandfather; No Known Problems in her  brother, brother, and sister.    Past Social History:  Social History   Socioeconomic History  . Marital status: Life Partner  Tobacco Use  . Smoking status: Never    Passive exposure: Never  . Smokeless tobacco: Never  Vaping Use  . Vaping status: Never Used  Substance and Sexual Activity  . Alcohol use: Yes  . Drug use: Never  . Sexual activity: Yes    Partners: Male    Birth control/protection: None   Social Drivers of Health   Financial Resource Strain: Low Risk  (12/17/2023)   Received from [facility]   Overall Financial Resource Strain (CARDIA)   . How hard is it for you to pay for the very basics like food, housing, medical care, and heating?: Not very hard  Food Insecurity: Food Insecurity Present (12/17/2023)   Received from [facility]   Hunger Vital Sign   . Within the past 12 months, you worried that your food would run out before you got the money to buy more.: Sometimes true   . Within the past 12 months, the food you bought just didn't last and you didn't have money to get more.: Sometimes true  Transportation Needs: No Transportation Needs (12/17/2023)   Received from [facility] - Transportation   . In the past 12 months, has lack of transportation kept you from medical appointments or from getting medications?: No   . In the past 12 months, has lack of transportation kept you from meetings, work, or from getting things needed for daily living?: No  Physical Activity: Sufficiently Active (12/17/2023)   Received from [facility]   Exercise Vital Sign   . On average, how many days per week do you engage in moderate to strenuous exercise (like a brisk walk)?: 4 days   . On average, how many minutes do you engage in exercise at this level?: 50 min  Stress: No Stress Concern Present (12/17/2023)   Received from [facility] - Occupational Stress Questionnaire   . Do you feel stress - tense, restless, nervous, or  anxious, or unable to sleep at night because your mind is troubled all the time - these days?: Only a little  Social Connections: Moderately Isolated (12/17/2023)   Received from [facility]   Social Connection and Isolation Panel   . In a typical week, how many times do you talk on the phone with family, friends, or neighbors?: More than three times a week   . How often do you get together with friends or relatives?: More than three times a week   . How often do you attend church or religious services?: Never   . Do you belong to any clubs or organizations such  as church groups, unions, fraternal or athletic groups, or school groups?: No   . Are you married, widowed, divorced, separated, never married, or living with a partner?: Living with partner   Allergies:  Cephalexin  Medications:  Current Outpatient Medications on File Prior to Visit  Medication Sig Dispense Refill  . cefdinir (OMNICEF) 300 mg capsule Take 1 capsule (300 mg total) by mouth 2 (two) times daily for 7 days 14 capsule 0  . fluticasone  propionate (FLONASE ) 50 mcg/actuation nasal spray Place 2 sprays into both nostrils once daily 16 g 0  . nortriptyline  (PAMELOR ) 10 MG capsule Take 10 mg by mouth at bedtime    . ondansetron  (ZOFRAN ) 4 MG tablet Take 4 mg by mouth every 8 (eight) hours as needed    . polyethylene glycol (MIRALAX) packet Take 1 packet (17 g total) by mouth once daily Mix in 4-8ounces of fluid prior to taking. 14 packet 0  . propranoloL (INDERAL LA) 60 MG LA capsule Take 1 capsule (60 mg total) by mouth at bedtime 30 capsule 3  . SUMAtriptan  (IMITREX ) 100 MG tablet Take 1 tablet (100 mg total) by mouth once daily as needed for Migraine You should try 1/2 pill first if that doesn't help you can take 100 mg pill. You can repeat a dose in 1-2 hours if no headache is not significantly better. One should not take more than 200 mg sumatriptan  in a day and no more than 400 mg sumatriptan  in a week. 20 tablet 4   No  current facility-administered medications on file prior to visit.    Detailed physical exam performed: Vital Signs:  Vitals:   02/21/24 0912  BP: 116/85  Pulse: 91  Weight: 100.2 kg (220 lb 12.8 oz)  PainSc:   7  PainLoc: Vagina   General: Well appearing female in no apparent distress. Extremities: No edema or varicosities Abdomen:  Soft, nontender, without hepatosplenomegaly or masses.   Lymphatic: No inguinal adenopathy. Back/spine:  Normal range of motion, no kyphosis, no CVA tenderness. Skin:  No rashes, ulcers or skin lesions noted.  No excessive hirsutism or acne noted.  Neurological:  Appears alert and oriented and is a good historian.  No gross abnormalities are noted. Psychological:  Normal affect and mood.  No signs of anxiety or depression noted.  Pelvic Exam:   Vulva:  Normal external genitalia without lesions.  Urethral meatus:  Normal without caruncle.  Urethra:  No masses or discharge. Bladder:  Normal without palpable masses or tenderness.  Vagina:   normal appearing vagina with normal color and discharge, no lesions, thick, white discharge present   Cervix:   normal appearing cervix without discharge or lesions, cervical motion tenderness absent.  Uterus:  uterus is normal size, shape, consistency and nontender  Adnexae:   normal adnexa in size, nontender and no masses  Anus/perineum:  No lesions seen.  Perineal body intact. Rectum: deferred  Wet mount: pH 5.5, negative whiff, no clue cells, no fungal elements, no trich noted, microscopy limited in the office  Assessment/Plan: 33 y.o. G1P1001 with ...  Diagnoses and all orders for this visit:  Dysuria  Screening for STD (sexually transmitted disease) -     Sexually Transmitted Infection (STI) Panel, Qualitative PCR -     Syphilis, Screen - Treponema Pallidum AB with Reflex to RPR; Future -     HIV-1 And HIV-2 Antibody & Antigen; Future -     Hepatitis B Surface Antigen; Future -     Hepatitis C  Antibody  with reflex to PCR; Future  Vaginal discharge -     Bacterial Vaginosis Gram Stain -     Culture, Candida Screen   Assessment & Plan Vulvovaginal irritation and pain Recurrent irritation and pain post-antibiotic use with recent negative urine culture and microscopy. Elevated vaginal pH noted. Differential includes yeast infection, bacterial vaginosis, and STIs. - Send swabs for chlamydia, gonorrhea, mycoplasma, trichomonas, bacterial vaginosis, and yeast infection. - Advise pelvic rest for 7 days. - Recommend acetaminophen  or ibuprofen for pain. - Suggest Dermablast spray or hydrocortisone cream for vulvar irritation. - Refer to primary care or urgent care for rectal discharge evaluation.  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 Previous HSG (normal) and semen analysis completed. No prenatal vitamins or supplements currently used. - Recommend starting prenatal vitamins immediately. - Advise husband to take Coenzyme Q10 (100-200 mg) and consider other supplements. - Suggest husband undergo second semen analysis after 2-7 days of abstinence. - Discuss potential use of boric acid supplement (Hylafem) post-menstruation if not pregnant. Do not use boric acid in pregnancy.   Follow-Up Follow-up needed based on lab results and ongoing symptoms. - Instruct to follow up with results of swabs and blood work. - Advise to return if infertility persists after 6-12 months of trying to conceive.      RTO  PRN   I spent a total of 35 minutes in both face-to-face and non-face-to-face activities, excluding procedures performed, for this visit on the date of this encounter.   Attestation Statement:   I personally performed the service, non-incident to. (WP)   ERICA COPPOLA, CNM  This note has been created using automated tools and reviewed for accuracy by ERICA COPPOLA.

## 2024-02-22 ENCOUNTER — Other Ambulatory Visit: Payer: Self-pay | Admitting: Nurse Practitioner

## 2024-02-22 DIAGNOSIS — B379 Candidiasis, unspecified: Secondary | ICD-10-CM

## 2024-02-24 NOTE — Telephone Encounter (Signed)
 Requested medication (s) are due for refill today - no  Requested medication (s) are on the active medication list -yes  Future visit scheduled -no  Last refill: 01/18/24 #1  Notes to clinic: off protocol- provider review   Requested Prescriptions  Pending Prescriptions Disp Refills   fluconazole  (DIFLUCAN ) 150 MG tablet [Pharmacy Med Name: FLUCONAZOLE  150 MG TABLET] 1 tablet 0    Sig: TAKE 1 TABLET BY MOUTH EVERY DAY     Off-Protocol Failed - 02/24/2024  2:01 PM      Failed - Medication not assigned to a protocol, review manually.      Passed - Valid encounter within last 12 months    Recent Outpatient Visits           2 months ago Morbid obesity [facility])   [place] [facility] [place], [place], PA-C   2 months ago Morbid obesity [facility])   [place] [facility] [place], Janna, PA-C                 Requested Prescriptions  Pending Prescriptions Disp Refills   fluconazole  (DIFLUCAN ) 150 MG tablet [Pharmacy Med Name: FLUCONAZOLE  150 MG TABLET] 1 tablet 0    Sig: TAKE 1 TABLET BY MOUTH EVERY DAY     Off-Protocol Failed - 02/24/2024  2:01 PM      Failed - Medication not assigned to a protocol, review manually.      Passed - Valid encounter within last 12 months    Recent Outpatient Visits           2 months ago Morbid obesity [facility])   [place] [facility] [place], [place], PA-C   2 months ago Morbid obesity [facility])   [place] [facility] [place], Janna, PA-C

## 2024-06-08 ENCOUNTER — Telehealth: Payer: Self-pay

## 2024-06-08 ENCOUNTER — Other Ambulatory Visit (HOSPITAL_COMMUNITY): Payer: Self-pay

## 2024-06-08 NOTE — Telephone Encounter (Signed)
 Pharmacy Patient Advocate Encounter  Received notification from [facility] that Prior Authorization for  Nurtec 75MG  dispersible tablets  has been APPROVED from 06/08/24 to 06/08/25. Ran test claim, Copay is $0. This test claim was processed through [facility]- copay amounts may vary at other pharmacies due to pharmacy/plan contracts, or as the patient moves through the different stages of their insurance plan.   PA #/Case ID/Reference #: [id number]

## 2024-06-08 NOTE — Telephone Encounter (Signed)
 Pharmacy Patient Advocate Encounter   Received notification from Onbase that prior authorization for Nurtec 75MG  dispersible tablets  is required/requested.   Insurance verification completed.   The patient is insured through [facility].   Per test claim: PA required; PA submitted to above mentioned insurance via Latent Key/confirmation #/[id number] Status is pending

## 2024-06-16 ENCOUNTER — Encounter: Payer: Self-pay | Admitting: Physician Assistant

## 2024-06-16 ENCOUNTER — Other Ambulatory Visit (HOSPITAL_COMMUNITY)
Admission: RE | Admit: 2024-06-16 | Discharge: 2024-06-16 | Disposition: A | Discharge status: Home / Self Care | Source: Ambulatory Visit | Attending: Physician Assistant | Admitting: Physician Assistant

## 2024-06-16 ENCOUNTER — Ambulatory Visit (INDEPENDENT_AMBULATORY_CARE_PROVIDER_SITE_OTHER): Admitting: Physician Assistant

## 2024-06-16 VITALS — BP 113/81 | HR 78 | Ht 61.0 in | Wt 225.9 lb

## 2024-06-16 DIAGNOSIS — N760 Acute vaginitis: Secondary | ICD-10-CM

## 2024-06-16 DIAGNOSIS — R309 Painful micturition, unspecified: Secondary | ICD-10-CM

## 2024-06-16 DIAGNOSIS — R399 Unspecified symptoms and signs involving the genitourinary system: Secondary | ICD-10-CM

## 2024-06-16 DIAGNOSIS — J069 Acute upper respiratory infection, unspecified: Secondary | ICD-10-CM

## 2024-06-16 LAB — POCT URINALYSIS DIPSTICK
Bilirubin, UA: NEGATIVE
Glucose, UA: NEGATIVE
Ketones, UA: NEGATIVE
Nitrite, UA: NEGATIVE
Protein, UA: POSITIVE — AB
Spec Grav, UA: 1.015 (ref 1.010–1.025)
Urobilinogen, UA: 0.2 U/dL
pH, UA: 7 (ref 5.0–8.0)

## 2024-06-16 NOTE — Progress Notes (Unsigned)
 Established patient visit  Patient: Nancy Ramos   DOB: 1990-09-02   33 y.o. Female  MRN: [id number] Visit Date: 06/16/2024  Today's healthcare provider: Jolynn Spencer, PA-C   Chief Complaint  Patient presents with   Urinary Tract Infection    Patient complains of pain when urinating, irritation, discomfort x 2 days  otc: monistat cream (caused some burning). L labia irritation. This started every time after her cycle.    Subjective     HPI     Urinary Tract Infection    Additional comments: Patient complains of pain when urinating, irritation, discomfort x 2 days  otc: monistat cream (caused some burning). L labia irritation. This started every time after her cycle.       Last edited by Wilfred Hargis GORMAN, CMA on 06/16/2024  9:28 AM.       Discussed the use of AI scribe software for clinical note transcription with the patient, who gave verbal consent to proceed.  History of Present Illness Nancy Ramos is a 33 year old female who presents with symptoms of a urinary tract infection and labial irritation.  She has had left labial irritation and pain since Sunday, with localized swelling and redness. She had similar symptoms in the past and was treated with Diflucan after a positive Candida test. Intercourse is painful, especially on the left side, and she uses KY water-based lubricant. Monistat cream caused burning. Symptoms worsened after her recent menstrual cycle and with intercourse.  She notes trace blood in her urine, with increased frequency and urgency but no dysuria. She has kidney stones, so she is concerned this may explain the hematuria. She denies flank or abdominal pain, fever, or significant back pain.  For the past three days she has had nasal congestion, sore throat, sinus pressure and mild cough, using Sudafed with some relief. She denies breathing difficulties.  She has migraines that typically affect the left side, similar to her current symptom  location.       12 /16/2025    9:29 AM 12/20/2023    4:32 PM 12/06/2023    1:19 PM  Depression screen PHQ 2/9  Decreased Interest 0 0 0  Down, Depressed, Hopeless 0 0 0  PHQ - 2 Score 0 0 0  Altered sleeping  0 0  Tired, decreased energy  0 0  Change in appetite  0 0  Feeling bad or failure about yourself   0 0  Trouble concentrating  0 0  Moving slowly or fidgety/restless  0 0  Suicidal thoughts  0 0  PHQ-9 Score  0  0   Difficult doing work/chores  Not difficult at all Not difficult at all     Data saved with a previous flowsheet row definition      12/20/2023    4:32 PM 12/06/2023    1:19 PM 07/15/2023    1:34 PM 02/27/2023    8:36 AM  GAD 7 : Generalized Anxiety Score  Nervous, Anxious, on Edge 0 0 0 1  Control/stop worrying 0 0 0 0  Worry too much - different things 0 0 0 0  Trouble relaxing 0 0 0 0  Restless 0 0 0 0  Easily annoyed or irritable 0 0 0 0  Afraid - awful might happen 0 0 0 1  Total GAD 7 Score 0 0 0 2  Anxiety Difficulty Not difficult at all Not difficult at all Not difficult at all     Medications: Show/hide medication list[1]  Review of Systems All negative Except see HPI   {Insert previous labs (optional):23779} {See past labs  Heme  Chem  Endocrine  Serology  Results Review (optional):1}   Objective    BP 113/81   Pulse 78   Ht 5' 1 (1.549 m)   Wt 225 lb 14.4 oz (102.5 kg)   LMP 06/01/2024   SpO2 99%   BMI 42.68 kg/m  {Insert last BP/Wt (optional):23777}{See vitals history (optional):1}   Physical Exam Exam conducted with a chaperone present.  Abdominal:     Hernia: There is no hernia in the left inguinal area or right inguinal area.  Genitourinary:    Exam position: Lithotomy position.     Labia:        Right: No rash, tenderness, lesion or injury.        Left: Tenderness and lesion present. No rash or injury.      Urethra: No prolapse, urethral pain, urethral swelling or urethral lesion.  Lymphadenopathy:     Lower  Body: No right inguinal adenopathy. No left inguinal adenopathy.      Results for orders placed or performed in visit on 06/16/24  POCT Urinalysis Dipstick  Result Value Ref Range   Color, UA Amber    Clarity, UA Cloudy    Glucose, UA Negative Negative   Bilirubin, UA Negative    Ketones, UA Negative    Spec Grav, UA 1.015 1.010 - 1.025   Blood, UA Trace (A)    pH, UA 7.0 5.0 - 8.0   Protein, UA Positive (A) Negative   Urobilinogen, UA 0.2 0.2 or 1.0 E.U./dL   Nitrite, UA Negative    Leukocytes, UA Large (3+) (A) Negative   Appearance     Odor          Assessment & Plan Vulvovaginitis (rule out candidiasis and bacterial vaginosis) Recurrent vulvovaginitis with irritation and swelling. Differential includes candidiasis, bacterial vaginosis, and irritation.  - Performed swab for bacterial vaginosis and trichomoniasis, ect. - Recommended over-the-counter low potency steroid cream for inflammation. - Advised to continue using of lubrication during intercourse to reduce irritation. - Discussed potential use of probiotics for overall vaginal health. Will reassess after  receiving lab results Will FU  Urinary tract infection Suspected UTI with urinary frequency, urgency, and hematuria. Differential includes UTI and possible kidney stones. - Ordered urinalysis and urine culture. - Advised to monitor symptoms and await test results before starting antibiotics. Will reassess after  receiving lab results Will FU  Acute upper respiratory infection Symptoms of nasal congestion, cough, and sinus pressure. Managed with Sudaflac. - Continue Sudafed for symptom relief. - Advised to stay warm, drink plenty of water, use nasal saline spray and flonase  otc, vicks vaporub and monitor symptoms. Rtc if symptoms persist   Orders Placed This Encounter  Procedures   Urine Culture   Urine Microscopic   POCT Urinalysis Dipstick    No follow-ups on file.   The patient was advised to call  back or seek an in-person evaluation if the symptoms worsen or if the condition fails to improve as anticipated.  I discussed the assessment and treatment plan with the patient. The patient was provided an opportunity to ask questions and all were answered. The patient agreed with the plan and demonstrated an understanding of the instructions.  I, Ginamarie Banfield, PA-C have reviewed all documentation for this visit. The documentation on 06/16/2024  for the exam, diagnosis, procedures, and orders are all accurate and complete.  Treon Kehl,  [facility], MMS [facility] [phone] (phone) [phone] (fax)  [facility]     [1]  Outpatient Medications Prior to Visit  Medication Sig   fluticasone  (FLONASE ) 50 MCG/ACT nasal spray Place 1 spray into both nostrils daily.   SUMAtriptan  (IMITREX ) 100 MG tablet Take 1 tablet (100 mg total) by mouth once as needed for up to 1 dose for migraine. May repeat in 2 hours if headache persists or recurs.   [DISCONTINUED] fluconazole  (DIFLUCAN ) 150 MG tablet Take 1 tablet (150 mg total) by mouth daily.   [DISCONTINUED] gabapentin  (NEURONTIN ) 600 MG tablet TAKE 1 TABLET(600 MG) BY MOUTH AT BEDTIME   [DISCONTINUED] nortriptyline  (PAMELOR ) 25 MG capsule Take 1 capsule (25 mg total) by mouth at bedtime.   [DISCONTINUED] ondansetron  (ZOFRAN ) 4 MG tablet Take 1 tablet (4 mg total) by mouth every 8 (eight) hours as needed for nausea or vomiting.   [DISCONTINUED] Rimegepant Sulfate (NURTEC) 75 MG TBDP 75 mg orally once every other day placed on or under the tongue   No facility-administered medications prior to visit.

## 2024-06-17 LAB — CERVICOVAGINAL ANCILLARY ONLY
Bacterial Vaginitis (gardnerella): NEGATIVE
Candida Glabrata: NEGATIVE
Candida Vaginitis: POSITIVE — AB
Chlamydia: NEGATIVE
Comment: NEGATIVE
Comment: NEGATIVE
Comment: NEGATIVE
Comment: NEGATIVE
Comment: NEGATIVE
Comment: NORMAL
Neisseria Gonorrhea: NEGATIVE
Trichomonas: NEGATIVE

## 2024-06-17 LAB — URINALYSIS, MICROSCOPIC ONLY
Bacteria, UA: NONE SEEN
Casts: NONE SEEN /LPF
Epithelial Cells (non renal): >10 /HPF — AB (ref 0–10)
RBC, Urine: >30 /HPF — AB (ref 0–2)

## 2024-06-19 ENCOUNTER — Encounter: Payer: Self-pay | Admitting: Physician Assistant

## 2024-06-20 ENCOUNTER — Ambulatory Visit: Payer: Self-pay | Admitting: Physician Assistant

## 2024-06-20 DIAGNOSIS — B3749 Other urogenital candidiasis: Secondary | ICD-10-CM

## 2024-06-20 DIAGNOSIS — B3731 Acute candidiasis of vulva and vagina: Secondary | ICD-10-CM

## 2024-06-21 LAB — URINE CULTURE

## 2024-06-22 ENCOUNTER — Other Ambulatory Visit: Payer: Self-pay

## 2024-06-23 ENCOUNTER — Other Ambulatory Visit: Payer: Self-pay | Admitting: Neurology

## 2024-06-23 ENCOUNTER — Telehealth: Payer: Self-pay

## 2024-06-23 MED ORDER — FLUCONAZOLE 200 MG PO TABS: 200.0000 mg | ORAL_TABLET | Freq: Every day | ORAL | 0 refills | Status: AC

## 2024-06-23 MED ORDER — FLUCONAZOLE 150 MG PO TABS: ORAL_TABLET | ORAL | 2 refills | Status: DC

## 2024-06-23 NOTE — Telephone Encounter (Signed)
 Per results you advise you would send medication in, pt is calling to have it be sent to pharmacy.

## 2024-06-23 NOTE — Telephone Encounter (Signed)
 Copied from CRM #[id number]. Topic: Clinical - Medication Question >> Jun 23, 2024  9:57 AM Shanda MATSU wrote: Reason for CRM: Patient called in to check status of med that is supposed to be sent to pharmacy for yeast infection, patient wants to make sure that med is sent today, patient's preferred pharmacy is  [id number] [place], [place] - [address] AT [facility] [address] [place] [address] [place] [address] Phone: [phone] Fax: [phone]

## 2024-06-25 ENCOUNTER — Other Ambulatory Visit: Payer: Self-pay | Admitting: Neurology

## 2024-07-07 ENCOUNTER — Encounter: Payer: Self-pay | Admitting: Physician Assistant

## 2024-07-27 ENCOUNTER — Ambulatory Visit: Admitting: Physician Assistant

## 2024-08-06 ENCOUNTER — Encounter: Payer: Self-pay | Admitting: Physician Assistant

## 2024-08-06 ENCOUNTER — Ambulatory Visit: Admitting: Physician Assistant

## 2024-08-06 VITALS — BP 109/74 | HR 80 | Temp 98.2°F | Resp 15 | Ht 61.0 in | Wt 222.4 lb

## 2024-08-06 DIAGNOSIS — J018 Other acute sinusitis: Secondary | ICD-10-CM | POA: Diagnosis not present

## 2024-08-06 MED ORDER — BENZONATATE 200 MG PO CAPS: 200.0000 mg | ORAL_CAPSULE | Freq: Three times a day (TID) | ORAL | 0 refills | Status: AC | PRN

## 2024-08-06 MED ORDER — AZITHROMYCIN 250 MG PO TABS: ORAL_TABLET | ORAL | 0 refills | Status: AC

## 2024-08-06 MED ORDER — IPRATROPIUM BROMIDE 0.03 % NA SOLN: 1.0000 | Freq: Two times a day (BID) | NASAL | 0 refills | Status: AC

## 2024-08-06 MED ORDER — PREDNISONE 20 MG PO TABS: 20.0000 mg | ORAL_TABLET | Freq: Every day | ORAL | 0 refills | Status: AC

## 2024-08-06 NOTE — Progress Notes (Signed)
 " Established patient visit  Patient: Nancy Ramos   DOB: 1990/07/19   33 y.o. Female  MRN: [id number] Visit Date: 08/06/2024  Today's healthcare provider: Jolynn Spencer, PA-C   Chief Complaint  Patient presents with   Acute Visit    3 weeks ago  - stuffy nose , left ear pain, coughing , shortness of breath, body aches .  Otc- mucinex , Advil sinus, cough syrup Symptoms worsening .    Subjective     HPI     Acute Visit    Additional comments: 3 weeks ago  - stuffy nose , left ear pain, coughing , shortness of breath, body aches .  Otc- mucinex , Advil sinus, cough syrup Symptoms worsening .       Last edited by Rosas, Joseline E, CMA on 08/06/2024  9:23 AM.       Discussed the use of AI scribe software for clinical note transcription with the patient, who gave verbal consent to proceed.  History of Present Illness Nancy Ramos is a 34 year old female who presents with a persistent cough and nasal congestion for three weeks.  She describes a persistent nasty cough and nasal congestion for about three weeks. The cough can be hard to stop and briefly makes it difficult to catch her breath. She has a stuffy nose and congested-sounding voice but no fever or sore throat. Her mother was recently ill, but the patient's symptoms started before her mother returned from [place]. She has been using nasal saline spray, antihistamines, Advil Sinus, and Mucinex without relief. She has never taken azithromycin  and has no medication allergies. She feels tired and called off work today due to symptoms, and plans to return Monday.       08/06/2024    9:29 AM 06/16/2024    9:29 AM 12/20/2023    4:32 PM  Depression screen PHQ 2/9  Decreased Interest 0 0 0  Down, Depressed, Hopeless 0 0 0  PHQ - 2 Score 0 0 0  Altered sleeping   0  Tired, decreased energy   0  Change in appetite   0  Feeling bad or failure about yourself    0  Trouble concentrating   0  Moving slowly or  fidgety/restless   0  Suicidal thoughts   0  PHQ-9 Score   0   Difficult doing work/chores   Not difficult at all     Data saved with a previous flowsheet row definition      08/06/2024    9:29 AM 12/20/2023    4:32 PM 12/06/2023    1:19 PM 07/15/2023    1:34 PM  GAD 7 : Generalized Anxiety Score  Nervous, Anxious, on Edge 0 0  0  0   Control/stop worrying 0 0  0  0   Worry too much - different things 0 0  0  0   Trouble relaxing 0 0  0  0   Restless 0 0  0  0   Easily annoyed or irritable 0 0  0  0   Afraid - awful might happen 0 0  0  0   Total GAD 7 Score 0 0 0 0  Anxiety Difficulty Not difficult at all Not difficult at all Not difficult at all Not difficult at all     Data saved with a previous flowsheet row definition    Medications: Show/hide medication list[1]  Review of Systems All negative Except see HPI  Objective    BP 109/74 (BP Location: Right Arm, Patient Position: Sitting, Cuff Size: Normal)   Pulse 80   Temp 98.2 F (36.8 C) (Oral)   Resp 15   Ht 5' 1 (1.549 m)   Wt 222 lb 6.4 oz (100.9 kg)   LMP 07/10/2024   SpO2 99%   BMI 42.02 kg/m     Physical Exam Vitals reviewed.  Constitutional:      Appearance: She is normal weight.  HENT:     Head: Normocephalic and atraumatic.     Right Ear: Ear canal and external ear normal.     Left Ear: Ear canal and external ear normal.     Nose: Congestion and rhinorrhea present.     Mouth/Throat:     Pharynx: Posterior oropharyngeal erythema present.     Comments: Postnasal drainage noted Eyes:     General: No scleral icterus.       Right eye: No discharge.        Left eye: No discharge.     Extraocular Movements: Extraocular movements intact.     Pupils: Pupils are equal, round, and reactive to light.  Cardiovascular:     Rate and Rhythm: Normal rate and regular rhythm.  Pulmonary:     Effort: Pulmonary effort is normal.     Breath sounds: Normal breath sounds.  Abdominal:     General:  Abdomen is flat. Bowel sounds are normal.     Palpations: Abdomen is soft.  Lymphadenopathy:     Cervical: No cervical adenopathy.  Neurological:     Mental Status: She is alert.      No results found for any visits on 08/06/24.      Assessment and Plan Assessment & Plan Subacute sinusitis with persistent cough Subacute sinusitis with persistent cough for three weeks. Current symptomatic treatment insufficient. - Prescribed azithromycin  for potential bacterial component. - Prescribed prednisone  for five days, twice daily, to reduce inflammation. - Prescribed Tessalon  for cough suppression. - Prescribed ipratropium nasal spray for persistent cough. - Advised continued use of nasal saline spray and air humidifier. - Recommended gradual introduction of medications to monitor tolerance. - Advised staying warm and careful transition to avoid exacerbation. Continue symptomatic treatment Will follow-up    No orders of the defined types were placed in this encounter.   No follow-ups on file.   The patient was advised to call back or seek an in-person evaluation if the symptoms worsen or if the condition fails to improve as anticipated.  I discussed the assessment and treatment plan with the patient. The patient was provided an opportunity to ask questions and all were answered. The patient agreed with the plan and demonstrated an understanding of the instructions.  I, Harleyquinn Gasser, PA-C have reviewed all documentation for this visit. The documentation on 08/06/2024  for the exam, diagnosis, procedures, and orders are all accurate and complete.  Jolynn Spencer, [facility], MMS [facility] [phone] (phone) [phone] (fax)  [facility]    [1]  Outpatient Medications Prior to Visit  Medication Sig   fluconazole  (DIFLUCAN ) 150 MG tablet daily as needed.   fluconazole  (DIFLUCAN ) 200 MG tablet Take 1 tablet (200 mg total) by mouth daily.   fluticasone   (FLONASE ) 50 MCG/ACT nasal spray Place 1 spray into both nostrils daily.   NURTEC 75 MG TBDP Every 2 days   SUMAtriptan  (IMITREX ) 100 MG tablet Take 1 tablet (100 mg total) by mouth once as needed for up to 1  dose for migraine. May repeat in 2 hours if headache persists or recurs.   No facility-administered medications prior to visit.   "

## 2024-08-18 ENCOUNTER — Ambulatory Visit: Admitting: Physician Assistant
